# Patient Record
Sex: Male | Born: 1955
Health system: Southern US, Community
[De-identification: ages and names within clinical notes are randomized; demographics above are authoritative.]

## PROBLEM LIST (undated history)

## (undated) DIAGNOSIS — C801 Malignant (primary) neoplasm, unspecified: Secondary | ICD-10-CM

## (undated) DIAGNOSIS — I10 Essential (primary) hypertension: Secondary | ICD-10-CM

## (undated) DIAGNOSIS — E785 Hyperlipidemia, unspecified: Secondary | ICD-10-CM

## (undated) DIAGNOSIS — M1990 Unspecified osteoarthritis, unspecified site: Secondary | ICD-10-CM

## (undated) HISTORY — PX: TONSILLECTOMY: SUR1361

## (undated) HISTORY — DX: Essential (primary) hypertension: I10

## (undated) HISTORY — PX: OTHER SURGICAL HISTORY: SHX169

## (undated) HISTORY — PX: KNEE ARTHROSCOPY W/ ACL RECONSTRUCTION: SHX1858

## (undated) HISTORY — DX: Hyperlipidemia, unspecified: E78.5

---

## 2013-01-18 ENCOUNTER — Encounter: Payer: Self-pay | Admitting: Podiatry

## 2013-01-18 ENCOUNTER — Ambulatory Visit (INDEPENDENT_AMBULATORY_CARE_PROVIDER_SITE_OTHER): Payer: BC Managed Care – PPO | Admitting: Podiatry

## 2013-01-18 ENCOUNTER — Ambulatory Visit (INDEPENDENT_AMBULATORY_CARE_PROVIDER_SITE_OTHER): Payer: BC Managed Care – PPO

## 2013-01-18 VITALS — BP 157/101 | HR 59 | Resp 16 | Ht 72.0 in | Wt 275.0 lb

## 2013-01-18 DIAGNOSIS — R52 Pain, unspecified: Secondary | ICD-10-CM

## 2013-01-18 DIAGNOSIS — M7750 Other enthesopathy of unspecified foot: Secondary | ICD-10-CM

## 2013-01-18 MED ORDER — TRIAMCINOLONE ACETONIDE 10 MG/ML IJ SUSP
10.0000 mg | Freq: Once | INTRAMUSCULAR | Status: AC
  Administered 2013-01-18: 10 mg

## 2013-01-18 NOTE — Progress Notes (Signed)
Subjective:     Patient ID: Gregory Holt, male   DOB: 25-May-1955, 58 y.o.   MRN: [id number]  Foot Pain   patient presents stating I'm having a lot of pain in the outside of my right foot of approximate 6 weeks duration with somewhat chronic fascially pain that's been there for years   Review of Systems  All other systems reviewed and are negative.       Objective:   Physical Exam  Nursing note and vitals reviewed. Constitutional: He is oriented to person, place, and time.  Cardiovascular: Intact distal pulses.   Musculoskeletal: Normal range of motion.  Neurological: He is oriented to person, place, and time.  Skin: Skin is warm.   neurovascular status intact with discomfort on the outside of the right foot with inflammation and fluid buildup noted around the peroneal tendon and just proximal to this area with no muscle strength loss noted. No equinus condition noted with normal range of motion    Assessment:     Tendinitis of the peroneal group right    Plan:     H&P performed and careful injection of the right lateral peroneal tendon near its insertion into the base of fifth metatarsal 3 mg Kenalog 5 of Xylocaine Marcaine mixture and advice him reduced activity and possible new orthotics in future reappoint her recheck

## 2013-01-18 NOTE — Progress Notes (Signed)
   Subjective:    Patient ID: Gregory Holt, male    DOB: February 07, 1955, 58 y.o.   MRN: [id number]  HPI N foot pain        L R lateral plantar midfoot        D 6 weeks        O unknown        C sharp pain        A no known trigger at times and wt-bearing        T ice, massage, and rest    Review of Systems  Constitutional: Negative.   HENT: Negative.   Eyes: Negative.   Respiratory: Negative.   Cardiovascular: Negative.   Gastrointestinal: Negative.   Endocrine: Negative.   Genitourinary: Negative.   Musculoskeletal: Positive for arthralgias.       Left knee  Skin: Negative.   Allergic/Immunologic: Negative.   Neurological: Negative.   Hematological: Negative.   Psychiatric/Behavioral: Negative.        Objective:   Physical Exam        Assessment & Plan:

## 2013-02-15 ENCOUNTER — Ambulatory Visit
Admission: RE | Admit: 2013-02-15 | Discharge: 2013-02-15 | Disposition: A | Discharge status: Home / Self Care | Payer: No Typology Code available for payment source | Source: Ambulatory Visit | Attending: Family Medicine | Admitting: Family Medicine

## 2013-02-15 ENCOUNTER — Other Ambulatory Visit: Payer: Self-pay | Admitting: Family Medicine

## 2013-02-15 DIAGNOSIS — M25562 Pain in left knee: Secondary | ICD-10-CM

## 2015-07-08 IMAGING — CR DG KNEE 1-2V*L*
3 series · 3 of 3 positions shown · non-contrast
Comparison: None.

ADDENDUM:
The original report was by Dr. Nich Aboytes. The following addendum
is by Dr. Jhovanny Bedoya:

The knee demonstrates findings of Kellgren Lawrence grade 4.
CLINICAL DATA: Chronic left knee pain.  Note injury and surgery.
EXAM:
LEFT KNEE - 1-2 VIEW

[view not recorded (1 of 3)]
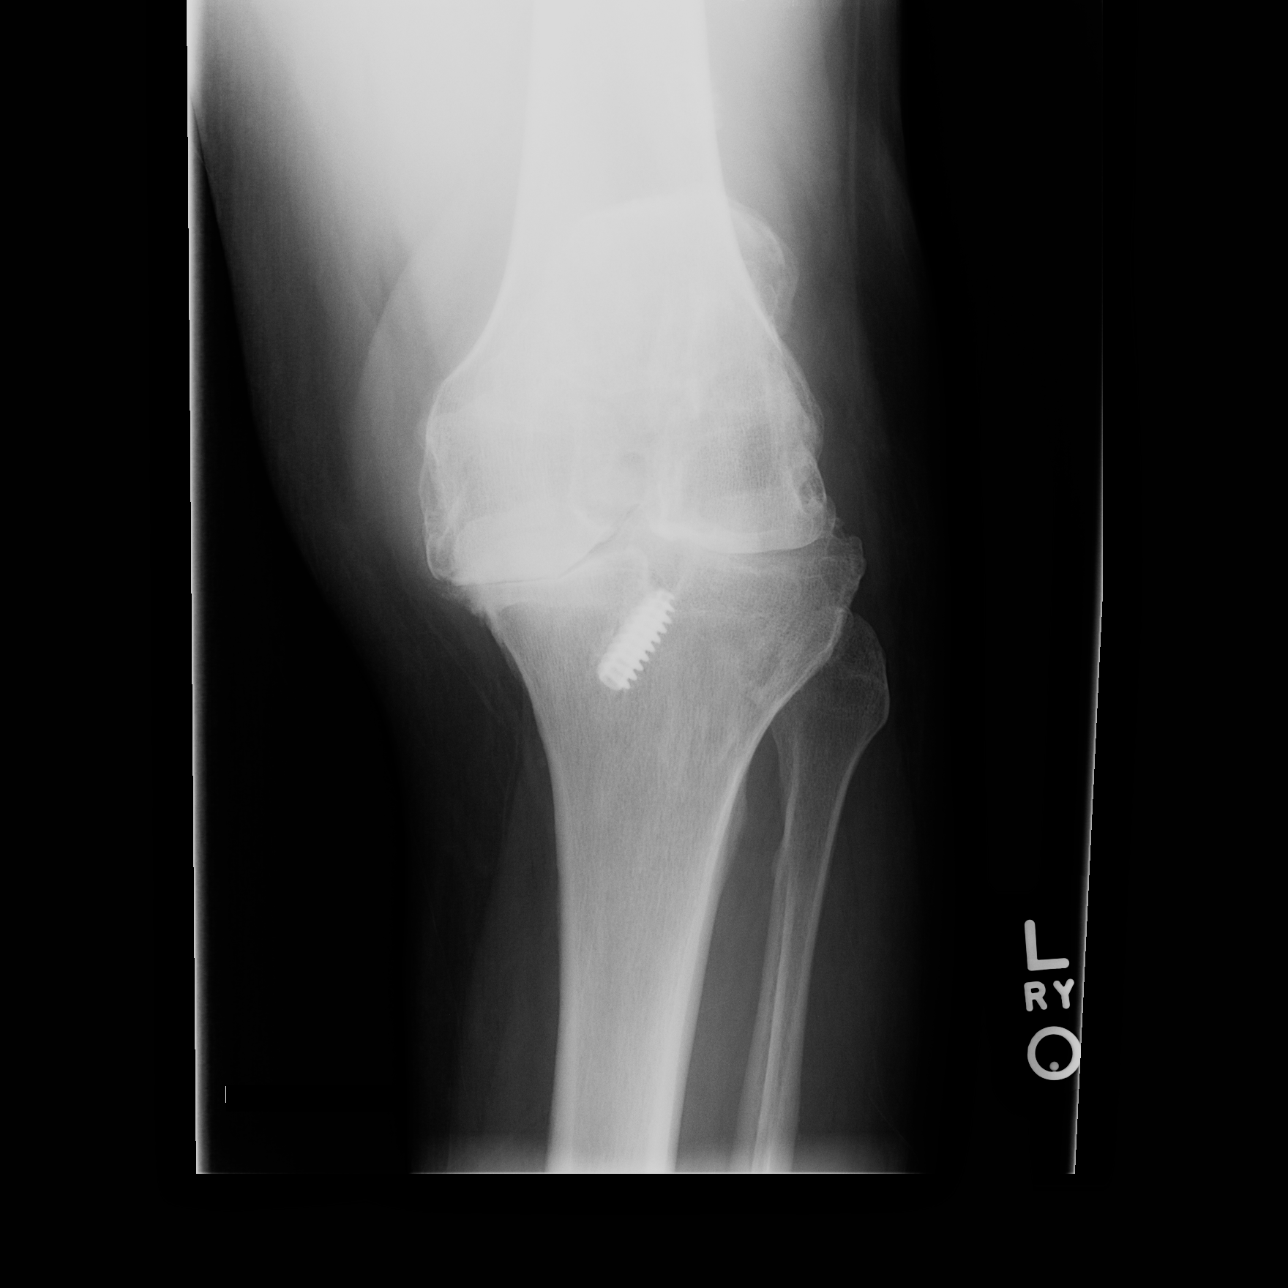

[view not recorded (2 of 3)]
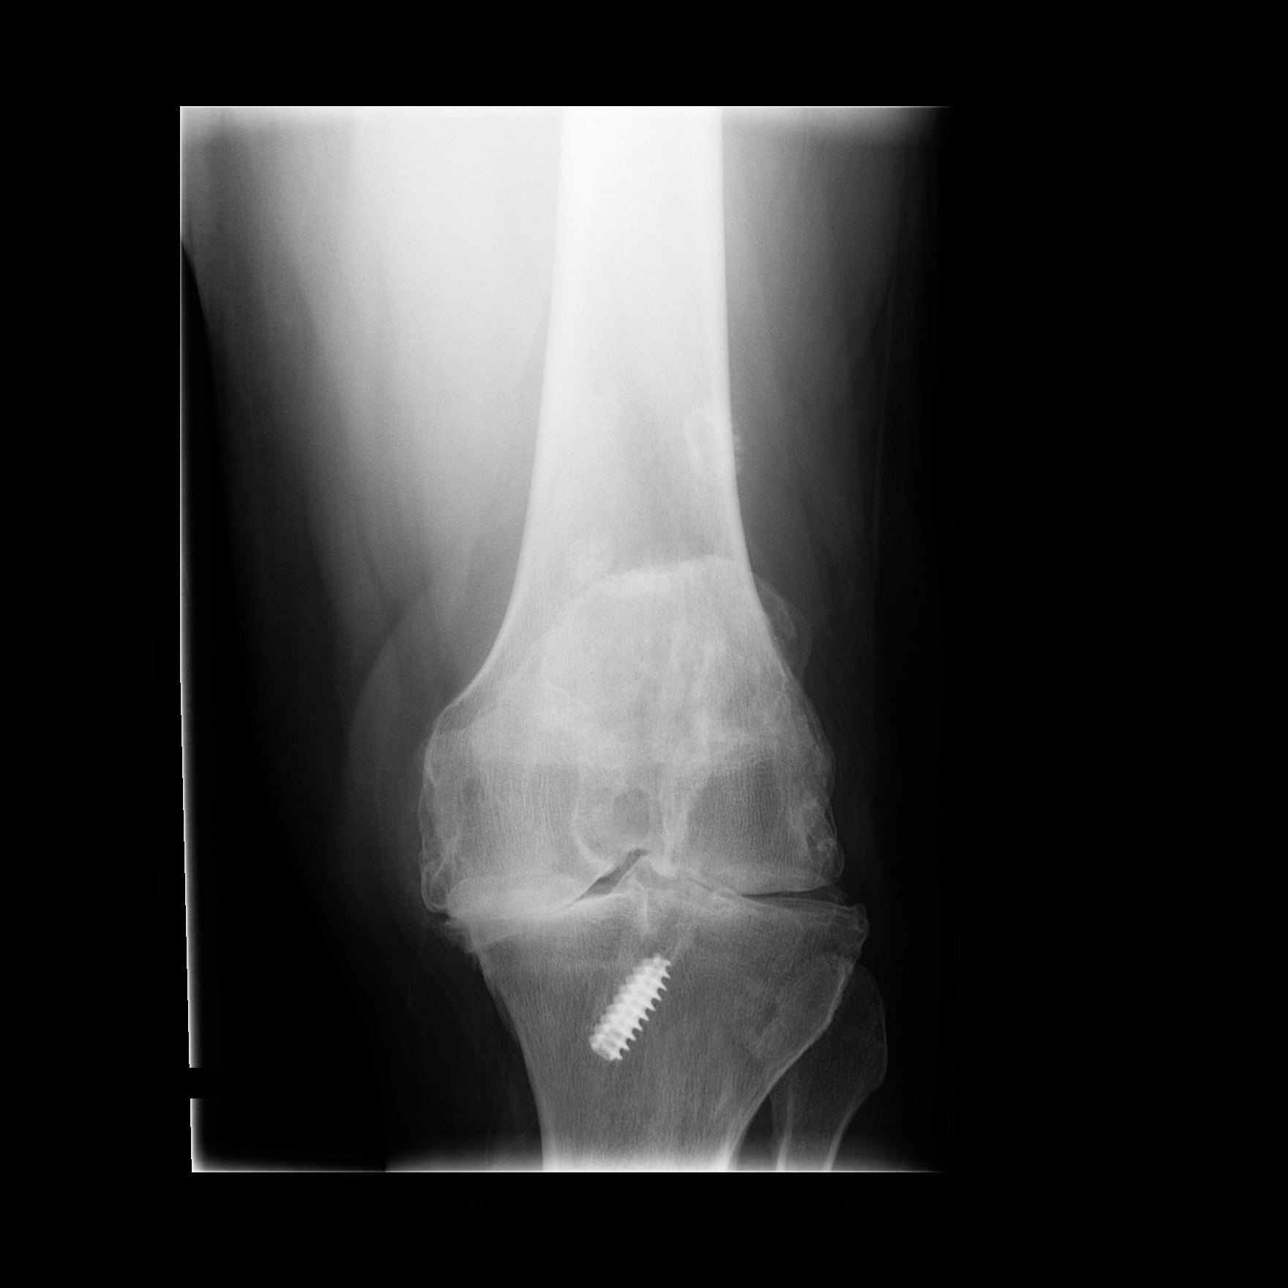

[view not recorded (3 of 3)]
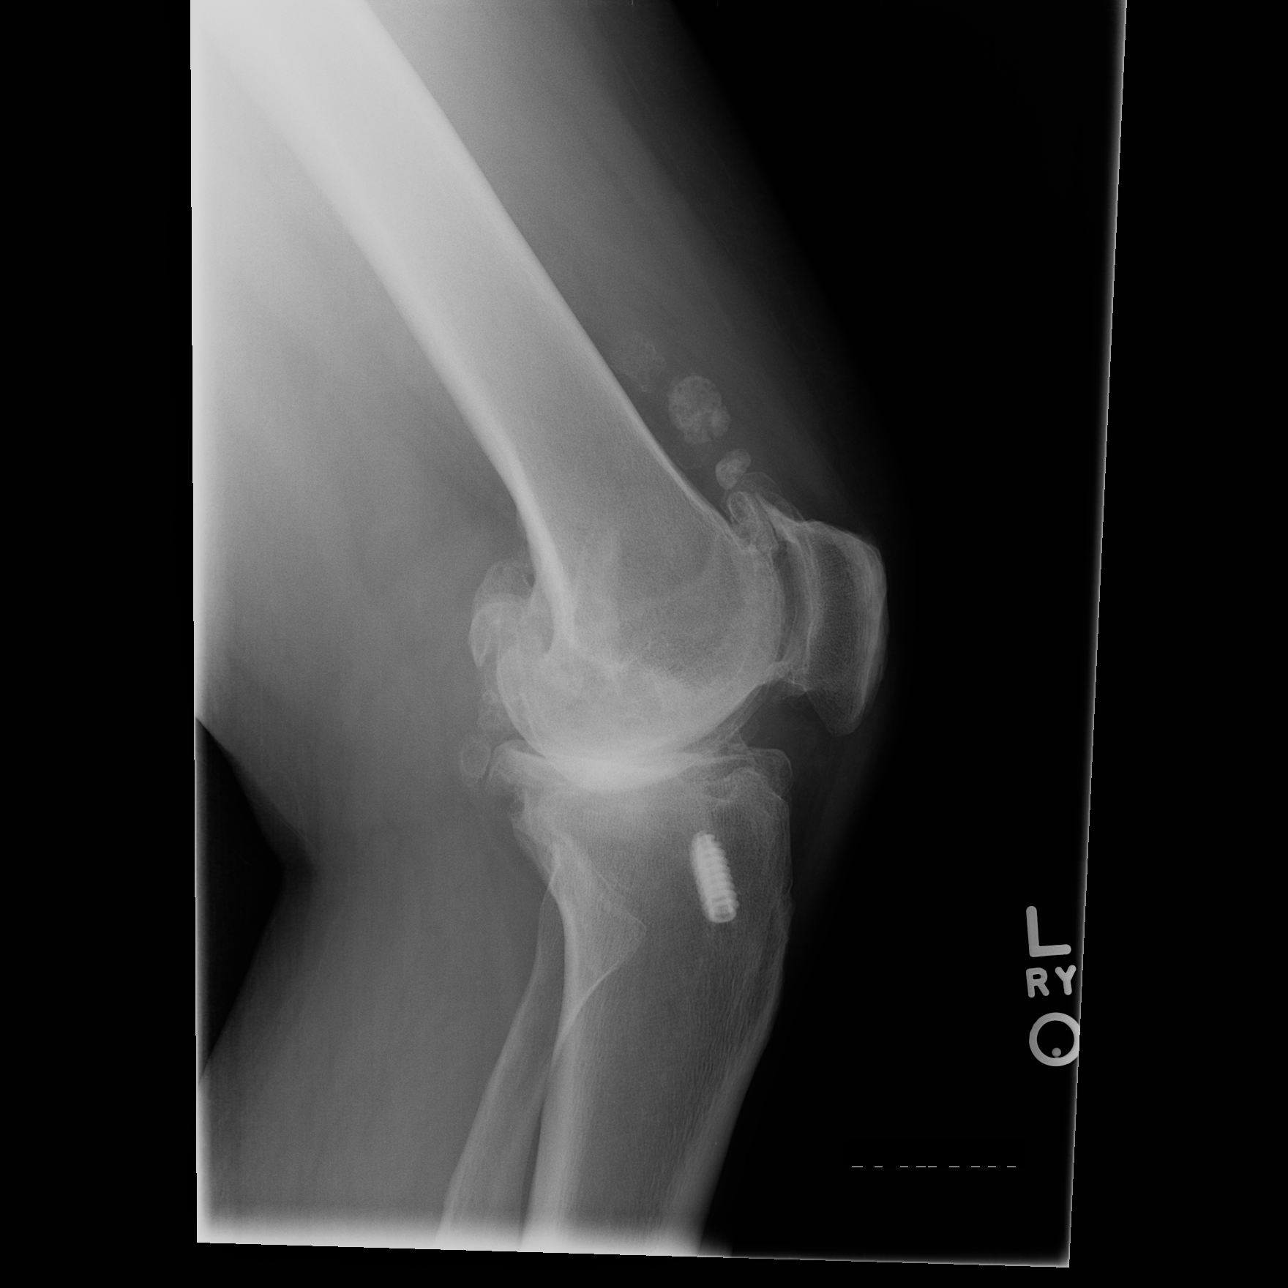

[3 of 3 positions shown; findings below may reference images not displayed]

FINDINGS: There are postsurgical changes consistent with prior ACL
reconstruction. There is an interference screw within the proximal
tibia. There are severe tricompartmental degenerative changes with
advanced joint space loss, osteophytes and multiple intra-articular
loose bodies. There is a small knee joint effusion. No acute
fracture or dislocation is demonstrated.
IMPRESSION: Advanced osteoarthritis with multiple intra-articular loose bodies
status post ACL reconstruction. No acute osseous findings.

## 2015-12-11 ENCOUNTER — Ambulatory Visit: Payer: Self-pay | Admitting: Podiatry

## 2016-01-14 ENCOUNTER — Ambulatory Visit: Payer: Self-pay | Admitting: Podiatry

## 2016-06-03 DIAGNOSIS — H168 Other keratitis: Secondary | ICD-10-CM | POA: Diagnosis not present

## 2016-06-15 DIAGNOSIS — M25562 Pain in left knee: Secondary | ICD-10-CM | POA: Diagnosis not present

## 2016-06-15 DIAGNOSIS — G8929 Other chronic pain: Secondary | ICD-10-CM | POA: Diagnosis not present

## 2016-06-15 DIAGNOSIS — M1712 Unilateral primary osteoarthritis, left knee: Secondary | ICD-10-CM | POA: Diagnosis not present

## 2016-07-09 DIAGNOSIS — I10 Essential (primary) hypertension: Secondary | ICD-10-CM | POA: Diagnosis not present

## 2016-07-09 DIAGNOSIS — E7800 Pure hypercholesterolemia, unspecified: Secondary | ICD-10-CM | POA: Diagnosis not present

## 2016-07-09 DIAGNOSIS — M25562 Pain in left knee: Secondary | ICD-10-CM | POA: Diagnosis not present

## 2016-09-03 DIAGNOSIS — G8929 Other chronic pain: Secondary | ICD-10-CM | POA: Diagnosis not present

## 2016-09-03 DIAGNOSIS — M25562 Pain in left knee: Secondary | ICD-10-CM | POA: Diagnosis not present

## 2016-09-03 DIAGNOSIS — M1712 Unilateral primary osteoarthritis, left knee: Secondary | ICD-10-CM | POA: Diagnosis not present

## 2016-11-24 DIAGNOSIS — I10 Essential (primary) hypertension: Secondary | ICD-10-CM | POA: Diagnosis not present

## 2016-11-24 DIAGNOSIS — E7800 Pure hypercholesterolemia, unspecified: Secondary | ICD-10-CM | POA: Diagnosis not present

## 2016-11-24 DIAGNOSIS — R600 Localized edema: Secondary | ICD-10-CM | POA: Diagnosis not present

## 2017-01-17 DIAGNOSIS — M1710 Unilateral primary osteoarthritis, unspecified knee: Secondary | ICD-10-CM | POA: Insufficient documentation

## 2017-01-17 DIAGNOSIS — M179 Osteoarthritis of knee, unspecified: Secondary | ICD-10-CM | POA: Insufficient documentation

## 2017-01-19 NOTE — H&P (Signed)
TOTAL KNEE ADMISSION H&P  Patient is being admitted for left total knee arthroplasty.  Subjective:  Chief Complaint:   Left knee primary OA / pain  HPI: Gregory Holt, 62 y.o. male, has a history of pain and functional disability in the left knee due to arthritis and has failed non-surgical conservative treatments for greater than 12 weeks to include NSAID's and/or analgesics, corticosteriod injections and activity modification.  Onset of symptoms was gradual, starting 4+ years ago with gradually worsening course since that time. The patient noted prior procedures on the knee to include  ACL reconstruction on the left knee(s).  Patient currently rates pain in the left knee(s) at 10 out of 10 with activity. Patient has worsening of pain with activity and weight bearing, pain that interferes with activities of daily living, pain with passive range of motion, crepitus and joint swelling.  Patient has evidence of periarticular osteophytes and joint space narrowing by imaging studies.  There is no active infection.  Risks, benefits and expectations were discussed with the patient.  Risks including but not limited to the risk of anesthesia, blood clots, nerve damage, blood vessel damage, failure of the prosthesis, infection and up to and including death.  Patient understand the risks, benefits and expectations and wishes to proceed with surgery.   PCP: Maury Dus, MD  D/C Plans:       Home  Post-op Meds:       No Rx given  Tranexamic Acid:      To be given - IV   Decadron:      Is to be given  FYI:     ASA  Norco (ok with Norco, oxy caused him to itch)  DME:   Pt already has equipment   PT:   OPPT Rx given    Past Medical History:  Diagnosis Date  . Hyperlipidemia   . Hypertension     Past Surgical History:  Procedure Laterality Date  . KNEE ARTHROSCOPY W/ ACL RECONSTRUCTION Left   . TONSILLECTOMY      No current facility-administered medications for this encounter.    Current  Outpatient Medications  Medication Sig Dispense Refill Last Dose  . acetaminophen (TYLENOL) 500 MG tablet Take 1,000 mg by mouth every 8 (eight) hours as needed for mild pain.     Marland Kitchen amLODipine (NORVASC) 5 MG tablet Take 5 mg by mouth daily.     Marland Kitchen ibuprofen (ADVIL,MOTRIN) 800 MG tablet Take 800 mg by mouth 2 (two) times daily as needed for mild pain (Alternates between Duexis and Ibuprofen).      . Ibuprofen-Famotidine 800-26.6 MG TABS Take 1 tablet by mouth 2 (two) times daily. Alternates between Duexis and Ibuprofen     . Iodoquinol-HC-Aloe Polysacch (ALCORTIN A EX) Apply 1 application topically daily as needed (rash).     Marland Kitchen losartan-hydrochlorothiazide (HYZAAR) 100-25 MG tablet Take 1 tablet by mouth daily.      Allergies  Allergen Reactions  . Oxycodone Itching    Social History   Tobacco Use  . Smoking status: Never Smoker  Substance Use Topics  . Alcohol use: Not on file    Family History  Problem Relation Age of Onset  . Hypertension Mother   . Heart disease Father      Review of Systems  Constitutional: Negative.   HENT: Negative.   Eyes: Negative.   Respiratory: Negative.   Cardiovascular: Negative.   Gastrointestinal: Negative.   Genitourinary: Negative.   Musculoskeletal: Positive for back pain and joint  pain.  Skin: Negative.   Neurological: Negative.   Endo/Heme/Allergies: Negative.   Psychiatric/Behavioral: Negative.     Objective:  Physical Exam  Constitutional: He is oriented to person, place, and time. He appears well-developed.  HENT:  Head: Normocephalic.  Eyes: Pupils are equal, round, and reactive to light.  Neck: Neck supple. No JVD present. No tracheal deviation present. No thyromegaly present.  Cardiovascular: Normal rate, regular rhythm and intact distal pulses.  No murmur heard. Respiratory: Effort normal and breath sounds normal. No respiratory distress. He has no wheezes.  GI: Soft. There is no tenderness. There is no guarding.   Musculoskeletal:       Left knee: He exhibits decreased range of motion, swelling and bony tenderness. He exhibits no ecchymosis, no deformity, no laceration and no erythema. Tenderness found.  Lymphadenopathy:    He has no cervical adenopathy.  Neurological: He is alert and oriented to person, place, and time.  Skin: Skin is warm and dry.  Psychiatric: He has a normal mood and affect.      Labs:  Estimated body mass index is 37.3 kg/m as calculated from the following:   Height as of 01/18/13: 6' (1.829 m).   Weight as of 01/18/13: 124.7 kg (275 lb).   Imaging Review Plain radiographs demonstrate severe degenerative joint disease of the left knee(s). The overall alignment is varus. The bone quality appears to be good for age and reported activity level.  Assessment/Plan:  End stage arthritis, left knee   The patient history, physical examination, clinical judgment of the provider and imaging studies are consistent with end stage degenerative joint disease of the left knee(s) and total knee arthroplasty is deemed medically necessary. The treatment options including medical management, injection therapy arthroscopy and arthroplasty were discussed at length. The risks and benefits of total knee arthroplasty were presented and reviewed. The risks due to aseptic loosening, infection, stiffness, patella tracking problems, thromboembolic complications and other imponderables were discussed. The patient acknowledged the explanation, agreed to proceed with the plan and consent was signed. Patient is being admitted for inpatient treatment for surgery, pain control, PT, OT, prophylactic antibiotics, VTE prophylaxis, progressive ambulation and ADL's and discharge planning. The patient is planning to be discharged home.      Gregory Pugh Travor Royce   PA-C  01/19/2017, 11:00 AM

## 2017-01-21 ENCOUNTER — Ambulatory Visit (INDEPENDENT_AMBULATORY_CARE_PROVIDER_SITE_OTHER): Payer: 59

## 2017-01-21 ENCOUNTER — Encounter: Payer: Self-pay | Admitting: Podiatry

## 2017-01-21 ENCOUNTER — Ambulatory Visit: Payer: 59 | Admitting: Podiatry

## 2017-01-21 ENCOUNTER — Other Ambulatory Visit: Payer: Self-pay | Admitting: Podiatry

## 2017-01-21 DIAGNOSIS — M79671 Pain in right foot: Secondary | ICD-10-CM

## 2017-01-21 DIAGNOSIS — M779 Enthesopathy, unspecified: Secondary | ICD-10-CM

## 2017-01-21 DIAGNOSIS — M722 Plantar fascial fibromatosis: Secondary | ICD-10-CM

## 2017-01-21 MED ORDER — TRIAMCINOLONE ACETONIDE 10 MG/ML IJ SUSP
10.0000 mg | Freq: Once | INTRAMUSCULAR | Status: AC
  Administered 2017-01-21: 10 mg

## 2017-01-21 NOTE — Progress Notes (Signed)
Subjective:   Patient ID: Gregory Holt, male   DOB: 62 y.o.   MRN: [id number]   HPI Patient presents with a painful arch right and pain in the outside of the right foot and states that he's getting ready for knee replacement and he cannot bear good weight on this foot and it's been getting worse over the last few months. Patient does not smoke and likes to be active   Review of Systems  All other systems reviewed and are negative.       Objective:  Physical Exam  Constitutional: He appears well-developed and well-nourished.  Cardiovascular: Intact distal pulses.  Pulmonary/Chest: Effort normal.  Musculoskeletal: Normal range of motion.  Neurological: He is alert.  Skin: Skin is warm.  Nursing note and vitals reviewed.   Neurovascular status intact muscle strength adequate range of motion within normal limits with patient found to have exquisite discomfort in the mid arch area right with inflammation fluid buildup and discomfort in the peroneal insertion right with pain with palpation. Has good digital perfusion and well oriented 3     Assessment:  Acute plantar fasciitis right with peroneal tendinitis right     Plan:  H&P condition reviewed and I went ahead and injected the sheath right 3 Milligan Kenalog 5 mill grams Xylocaine and I injected the mid arch area right 3 Milligan Kenalog 5 mill grams Xylocaine and applied fascial brace to lift the arch up. Reappoint to recheck if symptoms were to persist after having the knee replacement  X-rays indicate that the patient is a moderate depression of the arch but no indications of advanced arthritis

## 2017-01-25 NOTE — Patient Instructions (Signed)
Dearl Rudden  01/25/2017   Your procedure is scheduled on: 01/31/2017    Report to [facility] Main  Entrance  Follow signs to Short Stay on first floor at 530 AM  Call this number if you have problems the morning of surgery [phone]   Remember: Do not eat food or drink liquids :After Midnight.     Take these medicines the morning of surgery with A SIP OF WATER: Amlodipine ( NOrvasc)                                 You may not have any metal on your body including hair pins and              piercings  Do not wear jewelry,  lotions, powders or perfumes, deodorant           .              Men may shave face and neck.   Do not bring valuables to the hospital. [place].  Contacts, dentures or bridgework may not be worn into surgery.  Leave suitcase in the car. After surgery it may be brought to your room.       Special Instructions:               Please read over the following fact sheets you were given: _____________________________________________________________________             [facility] - Preparing for Surgery Before surgery, you can play an important role.  Because skin is not sterile, your skin needs to be as free of germs as possible.  You can reduce the number of germs on your skin by washing with CHG (chlorahexidine gluconate) soap before surgery.  CHG is an antiseptic cleaner which kills germs and bonds with the skin to continue killing germs even after washing. Please DO NOT use if you have an allergy to CHG or antibacterial soaps.  If your skin becomes reddened/irritated stop using the CHG and inform your nurse when you arrive at Short Stay. Do not shave (including legs and underarms) for at least 48 hours prior to the first CHG shower.  You may shave your face/neck. Please follow these instructions carefully:  1.  Shower with CHG Soap the night before surgery and the  morning of  Surgery.  2.  If you choose to wash your hair, wash your hair first as usual with your  normal  shampoo.  3.  After you shampoo, rinse your hair and body thoroughly to remove the  shampoo.                           4.  Use CHG as you would any other liquid soap.  You can apply chg directly  to the skin and wash                       Gently with a scrungie or clean washcloth.  5.  Apply the CHG Soap to your body ONLY FROM THE NECK DOWN.   Do not use on face/ open  Wound or open sores. Avoid contact with eyes, ears mouth and genitals (private parts).                       Wash face,  Genitals (private parts) with your normal soap.             6.  Wash thoroughly, paying special attention to the area where your surgery  will be performed.  7.  Thoroughly rinse your body with warm water from the neck down.  8.  DO NOT shower/wash with your normal soap after using and rinsing off  the CHG Soap.                9.  Pat yourself dry with a clean towel.            10.  Wear clean pajamas.            11.  Place clean sheets on your bed the night of your first shower and do not  sleep with pets. Day of Surgery : Do not apply any lotions/deodorants the morning of surgery.  Please wear clean clothes to the hospital/surgery center.  FAILURE TO FOLLOW THESE INSTRUCTIONS MAY RESULT IN THE CANCELLATION OF YOUR SURGERY PATIENT SIGNATURE_________________________________  NURSE SIGNATURE__________________________________  ________________________________________________________________________  WHAT IS A BLOOD TRANSFUSION? Blood Transfusion Information  A transfusion is the replacement of blood or some of its parts. Blood is made up of multiple cells which provide different functions.  Red blood cells carry oxygen and are used for blood loss replacement.  White blood cells fight against infection.  Platelets control bleeding.  Plasma helps clot blood.  Other blood products are  available for specialized needs, such as hemophilia or other clotting disorders. BEFORE THE TRANSFUSION  Who gives blood for transfusions?   Healthy volunteers who are fully evaluated to make sure their blood is safe. This is blood bank blood. Transfusion therapy is the safest it has ever been in the practice of medicine. Before blood is taken from a donor, a complete history is taken to make sure that person has no history of diseases nor engages in risky social behavior (examples are intravenous drug use or sexual activity with multiple partners). The donor's travel history is screened to minimize risk of transmitting infections, such as malaria. The donated blood is tested for signs of infectious diseases, such as HIV and hepatitis. The blood is then tested to be sure it is compatible with you in order to minimize the chance of a transfusion reaction. If you or a relative donates blood, this is often done in anticipation of surgery and is not appropriate for emergency situations. It takes many days to process the donated blood. RISKS AND COMPLICATIONS Although transfusion therapy is very safe and saves many lives, the main dangers of transfusion include:   Getting an infectious disease.  Developing a transfusion reaction. This is an allergic reaction to something in the blood you were given. Every precaution is taken to prevent this. The decision to have a blood transfusion has been considered carefully by your caregiver before blood is given. Blood is not given unless the benefits outweigh the risks. AFTER THE TRANSFUSION  Right after receiving a blood transfusion, you will usually feel much better and more energetic. This is especially true if your red blood cells have gotten low (anemic). The transfusion raises the level of the red blood cells which carry oxygen, and this usually causes an energy increase.  The  nurse administering the transfusion will monitor you carefully for  complications. HOME CARE INSTRUCTIONS  No special instructions are needed after a transfusion. You may find your energy is better. Speak with your caregiver about any limitations on activity for underlying diseases you may have. SEEK MEDICAL CARE IF:   Your condition is not improving after your transfusion.  You develop redness or irritation at the intravenous (IV) site. SEEK IMMEDIATE MEDICAL CARE IF:  Any of the following symptoms occur over the next 12 hours:  Shaking chills.  You have a temperature by mouth above 102 F (38.9 C), not controlled by medicine.  Chest, back, or muscle pain.  People around you feel you are not acting correctly or are confused.  Shortness of breath or difficulty breathing.  Dizziness and fainting.  You get a rash or develop hives.  You have a decrease in urine output.  Your urine turns a dark color or changes to pink, red, or brown. Any of the following symptoms occur over the next 10 days:  You have a temperature by mouth above 102 F (38.9 C), not controlled by medicine.  Shortness of breath.  Weakness after normal activity.  The white part of the eye turns yellow (jaundice).  You have a decrease in the amount of urine or are urinating less often.  Your urine turns a dark color or changes to pink, red, or brown. Document Released: 12/26/1999 Document Revised: 03/22/2011 Document Reviewed: 08/14/2007 ExitCare Patient Information 2014 [place].  _______________________________________________________________________  Incentive Spirometer  An incentive spirometer is a tool that can help keep your lungs clear and active. This tool measures how well you are filling your lungs with each breath. Taking long deep breaths may help reverse or decrease the chance of developing breathing (pulmonary) problems (especially infection) following:  A long period of time when you are unable to move or be active. BEFORE THE PROCEDURE   If  the spirometer includes an indicator to show your best effort, your nurse or respiratory therapist will set it to a desired goal.  If possible, sit up straight or lean slightly forward. Try not to slouch.  Hold the incentive spirometer in an upright position. INSTRUCTIONS FOR USE  1. Sit on the edge of your bed if possible, or sit up as far as you can in bed or on a chair. 2. Hold the incentive spirometer in an upright position. 3. Breathe out normally. 4. Place the mouthpiece in your mouth and seal your lips tightly around it. 5. Breathe in slowly and as deeply as possible, raising the piston or the ball toward the top of the column. 6. Hold your breath for 3-5 seconds or for as long as possible. Allow the piston or ball to fall to the bottom of the column. 7. Remove the mouthpiece from your mouth and breathe out normally. 8. Rest for a few seconds and repeat Steps 1 through 7 at least 10 times every 1-2 hours when you are awake. Take your time and take a few normal breaths between deep breaths. 9. The spirometer may include an indicator to show your best effort. Use the indicator as a goal to work toward during each repetition. 10. After each set of 10 deep breaths, practice coughing to be sure your lungs are clear. If you have an incision (the cut made at the time of surgery), support your incision when coughing by placing a pillow or rolled up towels firmly against it. Once you are able to get  out of bed, walk around indoors and cough well. You may stop using the incentive spirometer when instructed by your caregiver.  RISKS AND COMPLICATIONS  Take your time so you do not get dizzy or light-headed.  If you are in pain, you may need to take or ask for pain medication before doing incentive spirometry. It is harder to take a deep breath if you are having pain. AFTER USE  Rest and breathe slowly and easily.  It can be helpful to keep track of a log of your progress. Your caregiver can  provide you with a simple table to help with this. If you are using the spirometer at home, follow these instructions: [place] IF:   You are having difficultly using the spirometer.  You have trouble using the spirometer as often as instructed.  Your pain medication is not giving enough relief while using the spirometer.  You develop fever of 100.5 F (38.1 C) or higher. SEEK IMMEDIATE MEDICAL CARE IF:   You cough up bloody sputum that had not been present before.  You develop fever of 102 F (38.9 C) or greater.  You develop worsening pain at or near the incision site. MAKE SURE YOU:   Understand these instructions.  Will watch your condition.  Will get help right away if you are not doing well or get worse. Document Released: 05/10/2006 Document Revised: 03/22/2011 Document Reviewed: 07/11/2006 [facility] Patient Information 2014 [place], [place].   ________________________________________________________________________

## 2017-01-26 ENCOUNTER — Other Ambulatory Visit: Payer: Self-pay

## 2017-01-26 ENCOUNTER — Encounter (HOSPITAL_COMMUNITY): Payer: Self-pay

## 2017-01-26 ENCOUNTER — Encounter (HOSPITAL_COMMUNITY)
Admission: RE | Admit: 2017-01-26 | Discharge: 2017-01-26 | Disposition: A | Discharge status: Home / Self Care | Payer: 59 | Source: Ambulatory Visit | Attending: Orthopedic Surgery | Admitting: Orthopedic Surgery

## 2017-01-26 DIAGNOSIS — Z01810 Encounter for preprocedural cardiovascular examination: Secondary | ICD-10-CM | POA: Diagnosis present

## 2017-01-26 DIAGNOSIS — R9431 Abnormal electrocardiogram [ECG] [EKG]: Secondary | ICD-10-CM | POA: Diagnosis not present

## 2017-01-26 DIAGNOSIS — M1712 Unilateral primary osteoarthritis, left knee: Secondary | ICD-10-CM | POA: Insufficient documentation

## 2017-01-26 DIAGNOSIS — Z01818 Encounter for other preprocedural examination: Secondary | ICD-10-CM | POA: Diagnosis not present

## 2017-01-26 HISTORY — DX: Unspecified osteoarthritis, unspecified site: M19.90

## 2017-01-26 HISTORY — DX: Malignant (primary) neoplasm, unspecified: C80.1

## 2017-01-26 LAB — ABO/RH: ABO/RH(D): A POS

## 2017-01-26 LAB — CBC
HCT: 44.5 % (ref 39.0–52.0)
Hemoglobin: 15.2 g/dL (ref 13.0–17.0)
MCH: 31.4 pg (ref 26.0–34.0)
MCHC: 34.2 g/dL (ref 30.0–36.0)
MCV: 91.9 fL (ref 78.0–100.0)
Platelets: 242 10*3/uL (ref 150–400)
RBC: 4.84 MIL/uL (ref 4.22–5.81)
RDW: 12.5 % (ref 11.5–15.5)
WBC: 6.2 10*3/uL (ref 4.0–10.5)

## 2017-01-26 LAB — BASIC METABOLIC PANEL
Anion gap: 8 (ref 5–15)
BUN: 34 mg/dL — AB (ref 6–20)
CALCIUM: 9.3 mg/dL (ref 8.9–10.3)
CO2: 26 mmol/L (ref 22–32)
CREATININE: 0.8 mg/dL (ref 0.61–1.24)
Chloride: 104 mmol/L (ref 101–111)
GFR calc Af Amer: >60 mL/min (ref >60)
GFR calc non Af Amer: >60 mL/min (ref >60)
GLUCOSE: 134 mg/dL — AB (ref 65–99)
Potassium: 3.5 mmol/L (ref 3.5–5.1)
Sodium: 138 mmol/L (ref 135–145)

## 2017-01-26 LAB — SURGICAL PCR SCREEN
MRSA, PCR: NEGATIVE
Staphylococcus aureus: NEGATIVE

## 2017-01-26 NOTE — Progress Notes (Signed)
BMP done 01/26/17 routed via epic to dr Alvan Dame.

## 2017-01-26 NOTE — Progress Notes (Signed)
Final EKG done 01/26/17-epic.   Clearance- Dr Alyson Ingles- 11/27/16-chart.

## 2017-01-30 MED ORDER — DEXTROSE 5 % IV SOLN
3.0000 g | INTRAVENOUS | Status: AC
  Administered 2017-01-31: 3 g via INTRAVENOUS
  Filled 2017-01-30: qty 3

## 2017-01-30 MED ORDER — TRANEXAMIC ACID 1000 MG/10ML IV SOLN
1000.0000 mg | INTRAVENOUS | Status: AC
  Administered 2017-01-31: 1000 mg via INTRAVENOUS
  Filled 2017-01-30: qty 1100

## 2017-01-30 NOTE — Anesthesia Preprocedure Evaluation (Addendum)
Anesthesia Evaluation  Patient identified by MRN, date of birth, ID band Patient awake    Reviewed: Allergy & Precautions, NPO status , Patient's Chart, lab work & pertinent test results  Airway Mallampati: II  TM Distance: >3 FB Neck ROM: Full    Dental no notable dental hx.    Pulmonary neg pulmonary ROS,    Pulmonary exam normal breath sounds clear to auscultation       Cardiovascular hypertension, Pt. on medications negative cardio ROS Normal cardiovascular exam Rhythm:Regular Rate:Normal     Neuro/Psych negative neurological ROS  negative psych ROS   GI/Hepatic negative GI ROS, Neg liver ROS,   Endo/Other  negative endocrine ROSMorbid obesity  Renal/GU negative Renal ROS  negative genitourinary   Musculoskeletal negative musculoskeletal ROS (+) Arthritis , Osteoarthritis,    Abdominal   Peds negative pediatric ROS (+)  Hematology negative hematology ROS (+)   Anesthesia Other Findings   Reproductive/Obstetrics negative OB ROS                            Anesthesia Physical Anesthesia Plan  ASA: III  Anesthesia Plan: Spinal   Post-op Pain Management:  Regional for Post-op pain   Induction:   PONV Risk Score and Plan: 1 and Treatment may vary due to age or medical condition and Propofol infusion  Airway Management Planned: Nasal Cannula, Natural Airway and Mask  Additional Equipment:   Intra-op Plan:   Post-operative Plan:   Informed Consent:   Plan Discussed with: CRNA and Anesthesiologist  Anesthesia Plan Comments: (  )        Anesthesia Quick Evaluation

## 2017-01-31 ENCOUNTER — Ambulatory Visit (HOSPITAL_COMMUNITY): Payer: 59 | Admitting: Anesthesiology

## 2017-01-31 ENCOUNTER — Other Ambulatory Visit: Payer: Self-pay

## 2017-01-31 ENCOUNTER — Observation Stay (HOSPITAL_COMMUNITY)
Admission: RE | Admit: 2017-01-31 | Discharge: 2017-02-01 | Disposition: A | Discharge status: Home / Self Care | Payer: 59 | Source: Ambulatory Visit | Attending: Orthopedic Surgery | Admitting: Orthopedic Surgery

## 2017-01-31 ENCOUNTER — Encounter (HOSPITAL_COMMUNITY)
Admission: RE | Disposition: A | Discharge status: Home / Self Care | Payer: Self-pay | Source: Ambulatory Visit | Attending: Orthopedic Surgery

## 2017-01-31 ENCOUNTER — Encounter (HOSPITAL_COMMUNITY): Payer: Self-pay | Admitting: Emergency Medicine

## 2017-01-31 DIAGNOSIS — Z96659 Presence of unspecified artificial knee joint: Secondary | ICD-10-CM

## 2017-01-31 DIAGNOSIS — G8929 Other chronic pain: Secondary | ICD-10-CM | POA: Insufficient documentation

## 2017-01-31 DIAGNOSIS — M1712 Unilateral primary osteoarthritis, left knee: Secondary | ICD-10-CM | POA: Diagnosis not present

## 2017-01-31 DIAGNOSIS — I10 Essential (primary) hypertension: Secondary | ICD-10-CM | POA: Diagnosis not present

## 2017-01-31 DIAGNOSIS — Z85828 Personal history of other malignant neoplasm of skin: Secondary | ICD-10-CM | POA: Insufficient documentation

## 2017-01-31 DIAGNOSIS — Z791 Long term (current) use of non-steroidal anti-inflammatories (NSAID): Secondary | ICD-10-CM | POA: Insufficient documentation

## 2017-01-31 DIAGNOSIS — M25462 Effusion, left knee: Secondary | ICD-10-CM | POA: Diagnosis not present

## 2017-01-31 DIAGNOSIS — Z6834 Body mass index (BMI) 34.0-34.9, adult: Secondary | ICD-10-CM | POA: Diagnosis not present

## 2017-01-31 DIAGNOSIS — M25562 Pain in left knee: Secondary | ICD-10-CM

## 2017-01-31 DIAGNOSIS — T84498A Other mechanical complication of other internal orthopedic devices, implants and grafts, initial encounter: Secondary | ICD-10-CM | POA: Diagnosis not present

## 2017-01-31 DIAGNOSIS — M25762 Osteophyte, left knee: Secondary | ICD-10-CM | POA: Insufficient documentation

## 2017-01-31 DIAGNOSIS — M659 Synovitis and tenosynovitis, unspecified: Secondary | ICD-10-CM | POA: Insufficient documentation

## 2017-01-31 DIAGNOSIS — E785 Hyperlipidemia, unspecified: Secondary | ICD-10-CM | POA: Diagnosis not present

## 2017-01-31 DIAGNOSIS — Z96652 Presence of left artificial knee joint: Secondary | ICD-10-CM

## 2017-01-31 DIAGNOSIS — G8918 Other acute postprocedural pain: Secondary | ICD-10-CM | POA: Diagnosis not present

## 2017-01-31 DIAGNOSIS — Z79899 Other long term (current) drug therapy: Secondary | ICD-10-CM | POA: Insufficient documentation

## 2017-01-31 HISTORY — PX: TOTAL KNEE ARTHROPLASTY: SHX125

## 2017-01-31 LAB — TYPE AND SCREEN
ABO/RH(D): A POS
ANTIBODY SCREEN: NEGATIVE

## 2017-01-31 SURGERY — ARTHROPLASTY, KNEE, TOTAL
Anesthesia: Spinal | Site: Knee | Laterality: Left

## 2017-01-31 MED ORDER — FENTANYL CITRATE (PF) 100 MCG/2ML IJ SOLN
INTRAMUSCULAR | Status: DC | PRN
  Administered 2017-01-31: 100 ug via INTRAVENOUS

## 2017-01-31 MED ORDER — CIPROFLOXACIN HCL 0.3 % OP SOLN: 1.0000 [drp] | Freq: Three times a day (TID) | OPHTHALMIC | Status: DC

## 2017-01-31 MED ORDER — 0.9 % SODIUM CHLORIDE (POUR BTL) OPTIME
TOPICAL | Status: DC | PRN
  Administered 2017-01-31: 1000 mL

## 2017-01-31 MED ORDER — KETOROLAC TROMETHAMINE 30 MG/ML IJ SOLN: 30.0000 mg | Freq: Once | INTRAMUSCULAR | Status: DC | PRN

## 2017-01-31 MED ORDER — ONDANSETRON HCL 4 MG/2ML IJ SOLN: 4.0000 mg | Freq: Once | INTRAMUSCULAR | Status: DC | PRN

## 2017-01-31 MED ORDER — HYDROCODONE-ACETAMINOPHEN 7.5-325 MG PO TABS
2.0000 | ORAL_TABLET | ORAL | Status: DC | PRN
  Administered 2017-01-31 – 2017-02-01 (×5): 2 via ORAL
  Filled 2017-01-31 (×5): qty 2

## 2017-01-31 MED ORDER — POLYETHYLENE GLYCOL 3350 17 G PO PACK
17.0000 g | PACK | Freq: Two times a day (BID) | ORAL | Status: DC
  Administered 2017-02-01: 08:00:00 17 g via ORAL
  Filled 2017-01-31 (×2): qty 1

## 2017-01-31 MED ORDER — ACETAMINOPHEN 160 MG/5ML PO SOLN: 325.0000 mg | ORAL | Status: DC | PRN

## 2017-01-31 MED ORDER — FERROUS SULFATE 325 (65 FE) MG PO TABS: 325.0000 mg | ORAL_TABLET | Freq: Three times a day (TID) | ORAL | 3 refills | Status: DC

## 2017-01-31 MED ORDER — LIDOCAINE 2% (20 MG/ML) 5 ML SYRINGE
INTRAMUSCULAR | Status: DC | PRN
  Administered 2017-01-31: 40 mg via INTRAVENOUS

## 2017-01-31 MED ORDER — BUPIVACAINE IN DEXTROSE 0.75-8.25 % IT SOLN
INTRATHECAL | Status: DC | PRN
  Administered 2017-01-31: 2 mL via INTRATHECAL

## 2017-01-31 MED ORDER — METHOCARBAMOL 500 MG PO TABS
500.0000 mg | ORAL_TABLET | Freq: Four times a day (QID) | ORAL | Status: DC | PRN
  Administered 2017-01-31 – 2017-02-01 (×2): 500 mg via ORAL
  Filled 2017-01-31 (×2): qty 1

## 2017-01-31 MED ORDER — MEPERIDINE HCL 50 MG/ML IJ SOLN: 6.2500 mg | INTRAMUSCULAR | Status: DC | PRN

## 2017-01-31 MED ORDER — ACETAMINOPHEN 325 MG PO TABS: 325.0000 mg | ORAL_TABLET | ORAL | Status: DC | PRN

## 2017-01-31 MED ORDER — DEXAMETHASONE SODIUM PHOSPHATE 10 MG/ML IJ SOLN
10.0000 mg | Freq: Once | INTRAMUSCULAR | Status: AC
  Administered 2017-01-31: 10 mg via INTRAVENOUS

## 2017-01-31 MED ORDER — PROPOFOL 10 MG/ML IV BOLUS
INTRAVENOUS | Status: DC | PRN
  Administered 2017-01-31: 20 mg via INTRAVENOUS

## 2017-01-31 MED ORDER — ONDANSETRON HCL 4 MG/2ML IJ SOLN: 4.0000 mg | Freq: Four times a day (QID) | INTRAMUSCULAR | Status: DC | PRN

## 2017-01-31 MED ORDER — ASPIRIN 81 MG PO CHEW: 81.0000 mg | CHEWABLE_TABLET | Freq: Two times a day (BID) | ORAL | 0 refills | Status: AC

## 2017-01-31 MED ORDER — KETOROLAC TROMETHAMINE 30 MG/ML IJ SOLN
INTRAMUSCULAR | Status: AC
  Filled 2017-01-31: qty 1

## 2017-01-31 MED ORDER — HYDROCHLOROTHIAZIDE 25 MG PO TABS
25.0000 mg | ORAL_TABLET | Freq: Every day | ORAL | Status: DC
  Administered 2017-02-01: 12:00:00 25 mg via ORAL
  Filled 2017-01-31: qty 1

## 2017-01-31 MED ORDER — MIDAZOLAM HCL 5 MG/5ML IJ SOLN
INTRAMUSCULAR | Status: DC | PRN
  Administered 2017-01-31: 2 mg via INTRAVENOUS

## 2017-01-31 MED ORDER — PROPOFOL 500 MG/50ML IV EMUL
INTRAVENOUS | Status: DC | PRN
  Administered 2017-01-31: 75 ug/kg/min via INTRAVENOUS

## 2017-01-31 MED ORDER — HYDROCODONE-ACETAMINOPHEN 7.5-325 MG PO TABS: 1.0000 | ORAL_TABLET | ORAL | 0 refills | Status: DC | PRN

## 2017-01-31 MED ORDER — HYDROMORPHONE HCL 1 MG/ML IJ SOLN: 0.5000 mg | INTRAMUSCULAR | Status: DC | PRN

## 2017-01-31 MED ORDER — BUPIVACAINE HCL (PF) 0.25 % IJ SOLN
INTRAMUSCULAR | Status: AC
  Filled 2017-01-31: qty 30

## 2017-01-31 MED ORDER — SODIUM CHLORIDE 0.9 % IJ SOLN
INTRAMUSCULAR | Status: DC | PRN
  Administered 2017-01-31: 30 mL

## 2017-01-31 MED ORDER — CEFAZOLIN SODIUM-DEXTROSE 2-4 GM/100ML-% IV SOLN
2.0000 g | Freq: Four times a day (QID) | INTRAVENOUS | Status: AC
  Administered 2017-01-31 (×2): 2 g via INTRAVENOUS
  Filled 2017-01-31 (×2): qty 100

## 2017-01-31 MED ORDER — OXYCODONE HCL 5 MG/5ML PO SOLN
5.0000 mg | Freq: Once | ORAL | Status: DC | PRN
  Filled 2017-01-31: qty 5

## 2017-01-31 MED ORDER — ACETAMINOPHEN 325 MG PO TABS: 650.0000 mg | ORAL_TABLET | ORAL | Status: DC | PRN

## 2017-01-31 MED ORDER — FERROUS SULFATE 325 (65 FE) MG PO TABS
325.0000 mg | ORAL_TABLET | Freq: Three times a day (TID) | ORAL | Status: DC
  Administered 2017-01-31 – 2017-02-01 (×3): 325 mg via ORAL
  Filled 2017-01-31 (×3): qty 1

## 2017-01-31 MED ORDER — ONDANSETRON HCL 4 MG/2ML IJ SOLN
INTRAMUSCULAR | Status: AC
  Filled 2017-01-31: qty 2

## 2017-01-31 MED ORDER — METHOCARBAMOL 1000 MG/10ML IJ SOLN
500.0000 mg | Freq: Four times a day (QID) | INTRAVENOUS | Status: DC | PRN
  Administered 2017-01-31: 500 mg via INTRAVENOUS
  Filled 2017-01-31: qty 550

## 2017-01-31 MED ORDER — KETOROLAC TROMETHAMINE 0.5 % OP SOLN
1.0000 [drp] | Freq: Three times a day (TID) | OPHTHALMIC | Status: DC | PRN
  Filled 2017-01-31: qty 5

## 2017-01-31 MED ORDER — PROPOFOL 10 MG/ML IV BOLUS
INTRAVENOUS | Status: AC
  Filled 2017-01-31: qty 20

## 2017-01-31 MED ORDER — ACETAMINOPHEN 650 MG RE SUPP: 650.0000 mg | RECTAL | Status: DC | PRN

## 2017-01-31 MED ORDER — CELECOXIB 200 MG PO CAPS
200.0000 mg | ORAL_CAPSULE | Freq: Two times a day (BID) | ORAL | Status: DC
  Administered 2017-01-31 – 2017-02-01 (×2): 200 mg via ORAL
  Filled 2017-01-31 (×2): qty 1

## 2017-01-31 MED ORDER — FENTANYL CITRATE (PF) 100 MCG/2ML IJ SOLN: 25.0000 ug | INTRAMUSCULAR | Status: DC | PRN

## 2017-01-31 MED ORDER — SODIUM CHLORIDE 0.9 % IR SOLN
Status: DC | PRN
  Administered 2017-01-31: 1000 mL

## 2017-01-31 MED ORDER — DIPHENHYDRAMINE HCL 12.5 MG/5ML PO ELIX: 12.5000 mg | ORAL_SOLUTION | ORAL | Status: DC | PRN

## 2017-01-31 MED ORDER — METHOCARBAMOL 500 MG PO TABS: 500.0000 mg | ORAL_TABLET | Freq: Four times a day (QID) | ORAL | 0 refills | Status: AC | PRN

## 2017-01-31 MED ORDER — DOCUSATE SODIUM 100 MG PO CAPS
100.0000 mg | ORAL_CAPSULE | Freq: Two times a day (BID) | ORAL | Status: DC
  Administered 2017-01-31 – 2017-02-01 (×2): 100 mg via ORAL
  Filled 2017-01-31 (×2): qty 1

## 2017-01-31 MED ORDER — OXYCODONE HCL 5 MG PO TABS: 5.0000 mg | ORAL_TABLET | Freq: Once | ORAL | Status: DC | PRN

## 2017-01-31 MED ORDER — LOSARTAN POTASSIUM 50 MG PO TABS
100.0000 mg | ORAL_TABLET | Freq: Every day | ORAL | Status: DC
  Administered 2017-02-01: 12:00:00 100 mg via ORAL
  Filled 2017-01-31: qty 2

## 2017-01-31 MED ORDER — LIDOCAINE 2% (20 MG/ML) 5 ML SYRINGE
INTRAMUSCULAR | Status: AC
  Filled 2017-01-31: qty 5

## 2017-01-31 MED ORDER — KETOROLAC TROMETHAMINE 30 MG/ML IJ SOLN
INTRAMUSCULAR | Status: DC | PRN
  Administered 2017-01-31: 30 mg via INTRAMUSCULAR

## 2017-01-31 MED ORDER — SODIUM CHLORIDE 0.9 % IV SOLN
INTRAVENOUS | Status: DC
  Administered 2017-01-31: 12:00:00 via INTRAVENOUS

## 2017-01-31 MED ORDER — MIDAZOLAM HCL 2 MG/2ML IJ SOLN
INTRAMUSCULAR | Status: AC
  Filled 2017-01-31: qty 2

## 2017-01-31 MED ORDER — POLYETHYLENE GLYCOL 3350 17 G PO PACK: 17.0000 g | PACK | Freq: Two times a day (BID) | ORAL | 0 refills | Status: DC

## 2017-01-31 MED ORDER — ONDANSETRON HCL 4 MG PO TABS: 4.0000 mg | ORAL_TABLET | Freq: Four times a day (QID) | ORAL | Status: DC | PRN

## 2017-01-31 MED ORDER — AMLODIPINE BESYLATE 5 MG PO TABS
5.0000 mg | ORAL_TABLET | Freq: Every day | ORAL | Status: DC
  Administered 2017-02-01: 12:00:00 5 mg via ORAL
  Filled 2017-01-31: qty 1

## 2017-01-31 MED ORDER — DOCUSATE SODIUM 100 MG PO CAPS: 100.0000 mg | ORAL_CAPSULE | Freq: Two times a day (BID) | ORAL | 0 refills | Status: DC

## 2017-01-31 MED ORDER — DEXAMETHASONE SODIUM PHOSPHATE 10 MG/ML IJ SOLN
INTRAMUSCULAR | Status: AC
  Filled 2017-01-31: qty 1

## 2017-01-31 MED ORDER — SODIUM CHLORIDE 0.9 % IJ SOLN
INTRAMUSCULAR | Status: AC
  Filled 2017-01-31: qty 50

## 2017-01-31 MED ORDER — PHENOL 1.4 % MT LIQD: 1.0000 | OROMUCOSAL | Status: DC | PRN

## 2017-01-31 MED ORDER — METOCLOPRAMIDE HCL 5 MG PO TABS: 5.0000 mg | ORAL_TABLET | Freq: Three times a day (TID) | ORAL | Status: DC | PRN

## 2017-01-31 MED ORDER — ALUM & MAG HYDROXIDE-SIMETH 200-200-20 MG/5ML PO SUSP: 15.0000 mL | ORAL | Status: DC | PRN

## 2017-01-31 MED ORDER — TRANEXAMIC ACID 1000 MG/10ML IV SOLN
1000.0000 mg | Freq: Once | INTRAVENOUS | Status: AC
  Administered 2017-01-31: 12:00:00 1000 mg via INTRAVENOUS
  Filled 2017-01-31: qty 1100

## 2017-01-31 MED ORDER — LOSARTAN POTASSIUM-HCTZ 100-25 MG PO TABS: 1.0000 | ORAL_TABLET | Freq: Every day | ORAL | Status: DC

## 2017-01-31 MED ORDER — MENTHOL 3 MG MT LOZG: 1.0000 | LOZENGE | OROMUCOSAL | Status: DC | PRN

## 2017-01-31 MED ORDER — ASPIRIN 81 MG PO CHEW
81.0000 mg | CHEWABLE_TABLET | Freq: Two times a day (BID) | ORAL | Status: DC
  Administered 2017-01-31 – 2017-02-01 (×2): 81 mg via ORAL
  Filled 2017-01-31 (×2): qty 1

## 2017-01-31 MED ORDER — KETOROLAC TROMETHAMINE 0.5 % OP SOLN
1.0000 [drp] | Freq: Three times a day (TID) | OPHTHALMIC | Status: AC | PRN
  Administered 2017-01-31: 19:00:00 1 [drp] via OPHTHALMIC

## 2017-01-31 MED ORDER — HYDROCODONE-ACETAMINOPHEN 7.5-325 MG PO TABS
1.0000 | ORAL_TABLET | ORAL | Status: DC | PRN
  Administered 2017-01-31 (×2): 1 via ORAL
  Filled 2017-01-31 (×2): qty 1

## 2017-01-31 MED ORDER — DEXAMETHASONE SODIUM PHOSPHATE 10 MG/ML IJ SOLN
10.0000 mg | Freq: Once | INTRAMUSCULAR | Status: AC
  Administered 2017-02-01: 10 mg via INTRAVENOUS
  Filled 2017-01-31: qty 1

## 2017-01-31 MED ORDER — CIPROFLOXACIN HCL 0.3 % OP SOLN
1.0000 [drp] | Freq: Three times a day (TID) | OPHTHALMIC | Status: AC
  Administered 2017-01-31 – 2017-02-01 (×2): 1 [drp] via OPHTHALMIC
  Filled 2017-01-31: qty 2.5

## 2017-01-31 MED ORDER — BUPIVACAINE HCL (PF) 0.25 % IJ SOLN
INTRAMUSCULAR | Status: DC | PRN
  Administered 2017-01-31: 30 mL

## 2017-01-31 MED ORDER — LACTATED RINGERS IV SOLN
INTRAVENOUS | Status: DC | PRN
  Administered 2017-01-31 (×2): via INTRAVENOUS

## 2017-01-31 MED ORDER — BSS IO SOLN
15.0000 mL | Freq: Once | INTRAOCULAR | Status: DC
  Filled 2017-01-31: qty 15

## 2017-01-31 MED ORDER — CHLORHEXIDINE GLUCONATE 4 % EX LIQD: 60.0000 mL | Freq: Once | CUTANEOUS | Status: DC

## 2017-01-31 MED ORDER — METOCLOPRAMIDE HCL 5 MG/ML IJ SOLN: 5.0000 mg | Freq: Three times a day (TID) | INTRAMUSCULAR | Status: DC | PRN

## 2017-01-31 MED ORDER — BISACODYL 10 MG RE SUPP: 10.0000 mg | Freq: Every day | RECTAL | Status: DC | PRN

## 2017-01-31 MED ORDER — ONDANSETRON HCL 4 MG/2ML IJ SOLN
INTRAMUSCULAR | Status: DC | PRN
  Administered 2017-01-31: 4 mg via INTRAVENOUS

## 2017-01-31 MED ORDER — MEPERIDINE HCL 25 MG/ML IJ SOLN: 6.2500 mg | INTRAMUSCULAR | Status: DC | PRN

## 2017-01-31 MED ORDER — BUPIVACAINE HCL (PF) 0.75 % IJ SOLN
INTRAMUSCULAR | Status: DC | PRN
  Administered 2017-01-31: 25 mL

## 2017-01-31 MED ORDER — FENTANYL CITRATE (PF) 100 MCG/2ML IJ SOLN
INTRAMUSCULAR | Status: AC
  Filled 2017-01-31: qty 2

## 2017-01-31 MED ORDER — OXYCODONE HCL 5 MG/5ML PO SOLN: 5.0000 mg | Freq: Once | ORAL | Status: DC | PRN

## 2017-01-31 MED ORDER — PROPOFOL 10 MG/ML IV BOLUS
INTRAVENOUS | Status: AC
Start: 2017-01-31 — End: 2017-01-31
  Filled 2017-01-31: qty 60

## 2017-01-31 MED ORDER — MAGNESIUM CITRATE PO SOLN: 1.0000 | Freq: Once | ORAL | Status: DC | PRN

## 2017-01-31 SURGICAL SUPPLY — 53 items
ADH SKN CLS APL DERMABOND .7 (GAUZE/BANDAGES/DRESSINGS) ×1
BAG DECANTER FOR FLEXI CONT (MISCELLANEOUS) IMPLANT
BAG SPEC THK2 15X12 ZIP CLS (MISCELLANEOUS) ×1
BAG ZIPLOCK 12X15 (MISCELLANEOUS) ×2 IMPLANT
BANDAGE ACE 6X5 VEL STRL LF (GAUZE/BANDAGES/DRESSINGS) ×3 IMPLANT
BLADE SAW SGTL 11.0X1.19X90.0M (BLADE) IMPLANT
BLADE SAW SGTL 13.0X1.19X90.0M (BLADE) ×3 IMPLANT
BOWL SMART MIX CTS (DISPOSABLE) ×3 IMPLANT
CAPT KNEE TOTAL 3 ATTUNE ×2 IMPLANT
CEMENT HV SMART SET (Cement) ×4 IMPLANT
CONT SPEC 4OZ CLIKSEAL STRL BL (MISCELLANEOUS) ×2 IMPLANT
COVER SURGICAL LIGHT HANDLE (MISCELLANEOUS) ×3 IMPLANT
CUFF TOURN SGL QUICK 34 (TOURNIQUET CUFF) ×3
CUFF TRNQT CYL 34X4X40X1 (TOURNIQUET CUFF) ×1 IMPLANT
DECANTER SPIKE VIAL GLASS SM (MISCELLANEOUS) ×3 IMPLANT
DERMABOND ADVANCED (GAUZE/BANDAGES/DRESSINGS) ×2
DERMABOND ADVANCED .7 DNX12 (GAUZE/BANDAGES/DRESSINGS) ×1 IMPLANT
DRAPE U-SHAPE 47X51 STRL (DRAPES) ×3 IMPLANT
DRESSING AQUACEL AG SP 3.5X10 (GAUZE/BANDAGES/DRESSINGS) ×1 IMPLANT
DRSG AQUACEL AG SP 3.5X10 (GAUZE/BANDAGES/DRESSINGS) ×3
DURAPREP 26ML APPLICATOR (WOUND CARE) ×6 IMPLANT
ELECT REM PT RETURN 15FT ADLT (MISCELLANEOUS) ×3 IMPLANT
GLOVE BIOGEL M 7.0 STRL (GLOVE) IMPLANT
GLOVE BIOGEL PI IND STRL 7.5 (GLOVE) ×1 IMPLANT
GLOVE BIOGEL PI IND STRL 8.5 (GLOVE) ×1 IMPLANT
GLOVE BIOGEL PI INDICATOR 7.5 (GLOVE) ×2
GLOVE BIOGEL PI INDICATOR 8.5 (GLOVE) ×2
GLOVE ECLIPSE 8.0 STRL XLNG CF (GLOVE) ×3 IMPLANT
GLOVE ORTHO TXT STRL SZ7.5 (GLOVE) ×6 IMPLANT
GOWN STRL REUS W/TWL LRG LVL3 (GOWN DISPOSABLE) ×3 IMPLANT
GOWN STRL REUS W/TWL XL LVL3 (GOWN DISPOSABLE) ×3 IMPLANT
HANDPIECE INTERPULSE COAX TIP (DISPOSABLE) ×3
MANIFOLD NEPTUNE II (INSTRUMENTS) ×3 IMPLANT
NDL SAFETY ECLIPSE 18X1.5 (NEEDLE) IMPLANT
NEEDLE HYPO 18GX1.5 SHARP (NEEDLE) ×3
PACK TOTAL KNEE CUSTOM (KITS) ×3 IMPLANT
POSITIONER SURGICAL ARM (MISCELLANEOUS) ×3 IMPLANT
SET HNDPC FAN SPRY TIP SCT (DISPOSABLE) ×1 IMPLANT
SET PAD KNEE POSITIONER (MISCELLANEOUS) ×3 IMPLANT
SLEEVE SURGEON STRL (DRAPES) ×2 IMPLANT
SUT MNCRL AB 4-0 PS2 18 (SUTURE) ×3 IMPLANT
SUT STRATAFIX 1PDS 45CM VIOLET (SUTURE) IMPLANT
SUT STRATAFIX SPIRAL PDS+ 70CM (SUTURE)
SUT VIC AB 1 CT1 36 (SUTURE) ×5 IMPLANT
SUT VIC AB 2-0 CT1 27 (SUTURE) ×9
SUT VIC AB 2-0 CT1 TAPERPNT 27 (SUTURE) ×3 IMPLANT
SUTURE STRATFX SPIRL PDS+ 70CM (SUTURE) IMPLANT
SYR 3ML LL SCALE MARK (SYRINGE) ×2 IMPLANT
SYR 50ML LL SCALE MARK (SYRINGE) ×3 IMPLANT
TRAY FOLEY W/METER SILVER 16FR (SET/KITS/TRAYS/PACK) ×3 IMPLANT
WATER STERILE IRR 1000ML POUR (IV SOLUTION) ×5 IMPLANT
WRAP KNEE MAXI GEL POST OP (GAUZE/BANDAGES/DRESSINGS) ×3 IMPLANT
YANKAUER SUCT BULB TIP 10FT TU (MISCELLANEOUS) ×3 IMPLANT

## 2017-01-31 NOTE — Interval H&P Note (Signed)
History and Physical Interval Note:  01/31/2017 7:23 AM  Gregory Holt  has presented today for surgery, with the diagnosis of Left knee osteoarthritis  The various methods of treatment have been discussed with the patient and family. After consideration of risks, benefits and other options for treatment, the patient has consented to  Procedure(s) with comments: LEFT TOTAL KNEE ARTHROPLASTY (Left) - 90 mins as a surgical intervention .  The patient's history has been reviewed, patient examined, no change in status, stable for surgery.  I have reviewed the patient's chart and labs.  Questions were answered to the patient's satisfaction.     Mauri Pole

## 2017-01-31 NOTE — Op Note (Signed)
NAME:  Gregory Holt                      MEDICAL RECORD NO.:  [id number]                             FACILITY:  [facility]      PHYSICIAN:  Gregory Holt, M.D.  DATE OF BIRTH:  [birth year]      DATE OF PROCEDURE:  01/31/2017                                     OPERATIVE REPORT         PREOPERATIVE DIAGNOSIS:  Left knee osteoarthritis with history of prior ACL reconstruction with retained tibial screw     POSTOPERATIVE DIAGNOSIS:  Left knee osteoarthritis with history of prior ACL reconstruction with retained tibial screw      FINDINGS:  The patient was noted to have complete loss of cartilage and   bone-on-bone arthritis with associated osteophytes in all three compartments of   the knee with a significant synovitis and associated effusion.  Tibia screw impeded preparation of tibia so it needed to be removed     PROCEDURE:  Left total knee replacement.  #2 removal of hardware      COMPONENTS USED:  DePuy rotating platform posterior stabilized knee   system, a size 8 femur, 8 tibia, size 12 mm PS AOX insert, and 41 anatomic patellar   button.      SURGEON:  Gregory Holt, M.D.      ASSISTANT:  Gregory Orleans, PA-C.      ANESTHESIA:  Regional and Spinal.      SPECIMENS:  None.      COMPLICATION:  None.      DRAINS:  None.  EBL: <150cc      TOURNIQUET TIME:   Total Tourniquet Time Documented: Thigh (Left) - 50 minutes Total: Thigh (Left) - 50 minutes  .      The patient was stable to the recovery room.      INDICATION FOR PROCEDURE:  Gregory Holt is a 62 y.o. male patient of   mine.  The patient had been seen, evaluated, and treated conservatively in the   office with medication, activity modification, and injections.  The patient had   radiographic changes of bone-on-bone arthritis with endplate sclerosis and osteophytes noted.      The patient failed conservative measures including medication, injections, and activity modification, and at this point was ready  for more definitive measures.   Based on the radiographic changes and failed conservative measures, the patient   decided to proceed with total knee replacement.  Risks of infection,   DVT, component failure, need for revision surgery, postop course, and   expectations were all   discussed and reviewed.  Consent was obtained for benefit of pain   relief.      PROCEDURE IN DETAIL:  The patient was brought to the operative theater.   Once adequate anesthesia, preoperative antibiotics, 3 gm of Ancef, 1 gm of Tranexamic Acid, and 10 mg of Decadron administered, the patient was positioned supine with the left thigh tourniquet placed.  The  left lower extremity was prepped and draped in sterile fashion.  A time-   out was performed identifying the patient, planned procedure, and   extremity.  The left lower extremity was placed in the [facility] leg holder.  The leg was   exsanguinated, tourniquet elevated to 250 mmHg.  A midline incision was   made followed by median parapatellar arthrotomy.  Following initial   exposure, attention was first directed to the patella.  Precut   measurement was noted to be 27-28 mm.  I resected down to 16-17 mm and used a 41 anatomic patellar button to restore patellar height as well as cover the cut   surface.      The lug holes were drilled and a metal shim was placed to protect the   patella from retractors and saw blades.      At this point, attention was now directed to the femur.  The femoral   canal was opened with a drill, irrigated to try to prevent fat emboli.  An   intramedullary rod was passed at 5 degrees valgus, 9 mm of bone was   resected off the distal femur.  Following this resection, the tibia was   subluxated anteriorly.  Using the extramedullary guide, 2 mm of bone was resected off   the proximal medial tibia.  We confirmed the gap would be   stable medially and laterally with at least the size 7 mm spacer block as well as confirmed   the  cut was perpendicular in the coronal plane, checking with an alignment rod.      Once this was done, I sized the femur to be a size 8 in the anterior-   posterior dimension, chose a standard component based on medial and   lateral dimension.  The size 8 rotation block was then pinned in   position anterior referenced using the C-clamp to set rotation.  The   anterior, posterior, and  chamfer cuts were made without difficulty nor   notching making certain that I was along the anterior cortex to help   with flexion gap stability.  Posterior osteophytes and posterior capsule were removed and stripped due to radiographic findings and flexion contracture.     The final box cut was made off the lateral aspect of distal femur.      At this point, the tibia was sized to be a size 8, the size 8 tray was   then pinned in position through the medial third of the tubercle.  Once I began to drill for the keel the ACL screw was present.  It was identified and then removed from surrounding bone with osteotome.  It was removed without significant bone loss.  Once out the tray was re-positioned, drilled, and keel punched.  Trial reduction was now carried with a 8 femur,  8 tibia, then up to the size 12 mm PS insert (despite 5-10 degree pre-op flexion contracture), and the 41 anatomic patella botton.  The knee was brought to   extension, full extension with good flexion stability with the patella   tracking through the trochlea without application of pressure.  Given   all these findings the femoral lug holes were drilled and then the trial components removed.  Final components were   opened and cement was mixed.  The knee was irrigated with normal saline   solution and pulse lavage.  The synovial lining was   then injected with 30 cc of 0.25% Marcaine with epinephrine and 1 cc of Toradol plus 30 cc of NS for a total of 61 cc.      The knee was irrigated.  Final implants  were then cemented onto clean and    dried cut surfaces of bone with the knee brought to extension with a 12 mm PS trial insert.      Once the cement had fully cured, the excess cement was removed   throughout the knee.  I confirmed I was satisfied with the range of   motion and stability, and the final size 12 mm PS AOX insert was chosen.  It was   placed into the knee.      The tourniquet had been let down at 50 minutes.  No significant   hemostasis required.  The   extensor mechanism was then reapproximated using #1 Vicryl and #1 Stratafix sutures with the knee   in flexion.  The   remaining wound was closed with 2-0 Vicryl and running 4-0 Monocryl.   The knee was cleaned, dried, dressed sterilely using Dermabond and   Aquacel dressing.  The patient was then   brought to recovery room in stable condition, tolerating the procedure   well.   Please note that Physician Assistant, Gregory Orleans, PA-C, was present for the entirety of the case, and was utilized for pre-operative positioning, peri-operative retractor management, general facilitation of the procedure.  He was also utilized for primary wound closure at the end of the case.              Gregory Cassis Alvan Holt, M.D.    01/31/2017 9:19 AM

## 2017-01-31 NOTE — Transfer of Care (Signed)
Immediate Anesthesia Transfer of Care Note  Patient: Gregory Holt  Procedure(s) Performed: LEFT TOTAL KNEE ARTHROPLASTY (Left Knee)  Patient Location: PACU  Anesthesia Type:Spinal  Level of Consciousness: awake, alert  and oriented  Airway & Oxygen Therapy: Patient Spontanous Breathing and Patient connected to face mask oxygen  Post-op Assessment: Report given to RN  Post vital signs: Reviewed and stable  Last Vitals:  Vitals:   01/31/17 0603  BP: (!) 145/93  Pulse: 70  Resp: 18  Temp: 36.9 C  SpO2: 98%    Last Pain:  Vitals:   01/31/17 0603  TempSrc: Oral      Patients Stated Pain Goal: 4 (62/03/[birth year] 9741)  Complications: No apparent anesthesia complications

## 2017-01-31 NOTE — Addendum Note (Signed)
Addendum  created 01/31/17 1102 by Janeece Riggers, MD   Order list changed, Order sets accessed

## 2017-01-31 NOTE — Anesthesia Procedure Notes (Signed)
Anesthesia Regional Block: Adductor canal block   Pre-Anesthetic Checklist: ,, timeout performed, Correct Patient, Correct Site, Correct Laterality, Correct Procedure, Correct Position, site marked, Risks and benefits discussed,  Surgical consent,  Pre-op evaluation,  At surgeon's request and post-op pain management  Laterality: Left  Prep: chloraprep       Needles:  Injection technique: Single-shot  Needle Type: Echogenic Stimulator Needle     Needle Length: 5cm  Needle Gauge: 22     Additional Needles:   Procedures:, nerve stimulator,,, ultrasound used (permanent image in chart),,,,  Narrative:  Start time: 01/31/2017 6:55 AM End time: 01/31/2017 7:00 AM Injection made incrementally with aspirations every 5 mL.  Performed by: Personally  Anesthesiologist: Janeece Riggers, MD  Additional Notes: Functioning IV was confirmed and monitors were applied.  A 64mm 22ga Arrow echogenic stimulator needle was used. Sterile prep and drape,hand hygiene and sterile gloves were used. Ultrasound guidance: relevant anatomy identified, needle position confirmed, local anesthetic spread visualized around nerve(s)., vascular puncture avoided.  Image printed for medical record. Negative aspiration and negative test dose prior to incremental administration of local anesthetic. The patient tolerated the procedure well.

## 2017-01-31 NOTE — Anesthesia Postprocedure Evaluation (Signed)
Anesthesia Post Note  Patient: Gregory Holt  Procedure(s) Performed: LEFT TOTAL KNEE ARTHROPLASTY (Left Knee)     Patient location during evaluation: PACU Anesthesia Type: Spinal Level of consciousness: oriented and awake and alert Pain management: pain level controlled Vital Signs Assessment: post-procedure vital signs reviewed and stable Respiratory status: spontaneous breathing, respiratory function stable and patient connected to nasal cannula oxygen Cardiovascular status: blood pressure returned to baseline and stable Postop Assessment: no headache, no backache and no apparent nausea or vomiting Anesthetic complications: no    Last Vitals:  Vitals:   01/31/17 0603  BP: (!) 145/93  Pulse: 70  Resp: 18  Temp: 36.9 C  SpO2: 98%    Last Pain:  Vitals:   01/31/17 0603  TempSrc: Oral                 Abron Neddo

## 2017-01-31 NOTE — Progress Notes (Signed)
Applied one gtt ketorolac soluton to per instructions.

## 2017-01-31 NOTE — Anesthesia Procedure Notes (Signed)
Spinal  Patient location during procedure: OR Start time: 01/31/2017 7:34 AM End time: 01/31/2017 7:36 AM Staffing Anesthesiologist: Janeece Riggers, MD Preanesthetic Checklist Completed: patient identified, site marked, surgical consent, pre-op evaluation, timeout performed, IV checked, risks and benefits discussed and monitors and equipment checked Spinal Block Patient position: sitting Prep: DuraPrep Patient monitoring: heart rate, cardiac monitor, continuous pulse ox and blood pressure Approach: midline Location: L4-5 Injection technique: single-shot Needle Needle type: Sprotte  Needle gauge: 24 G Needle length: 9 cm Assessment Sensory level: T4

## 2017-01-31 NOTE — Evaluation (Signed)
Physical Therapy Evaluation Patient Details Name: Gregory Holt MRN: [id number] DOB: [birth year] Today's Date: 01/31/2017   History of Present Illness  LTKA  Clinical Impression  The patient ambulated x 120'. Plans Dc tomorrow. Pt admitted with above diagnosis. Pt currently with functional limitations due to the deficits listed below (see PT Problem List). Pt will benefit from skilled PT to increase their independence and safety with mobility to allow discharge to the venue listed below.       Follow Up Recommendations DC plan and follow up therapy as arranged by surgeon    Equipment Recommendations  None recommended by PT    Recommendations for Other Services OT consult     Precautions / Restrictions Precautions Precautions: Knee      Mobility  Bed Mobility Overal bed mobility: Needs Assistance Bed Mobility: Supine to Sit;Sit to Supine     Supine to sit: Min assist Sit to supine: Min assist   General bed mobility comments: assist with left leg  Transfers Overall transfer level: Needs assistance Equipment used: Rolling walker (2 wheeled) Transfers: Sit to/from Stand Sit to Stand: Min assist         General transfer comment: cues for hand and left leg position  Ambulation/Gait Ambulation/Gait assistance: Min assist Ambulation Distance (Feet): 120 Feet Assistive device: Rolling walker (2 wheeled) Gait Pattern/deviations: Step-to pattern;Step-through pattern;Antalgic     General Gait Details: cues for sequence  Stairs            Wheelchair Mobility    Modified Rankin (Stroke Patients Only)       Balance                                             Pertinent Vitals/Pain Pain Assessment: 0-10 Pain Score: 4  Pain Location: left knee Pain Descriptors / Indicators: Aching;Tender Pain Intervention(s): Premedicated before session;Ice applied;Monitored during session    Home Living Family/patient expects to be discharged to::  Private residence Living Arrangements: Spouse/significant other Available Help at Discharge: Family Type of Home: House Home Access: Stairs to enter   Technical brewer of Steps: 2 Home Layout: 1/2 bath on main level;Two level Home Equipment: Walker - 2 wheels;Crutches      Prior Function Level of Independence: Independent               Hand Dominance        Extremity/Trunk Assessment        Lower Extremity Assessment Lower Extremity Assessment: LLE deficits/detail LLE Deficits / Details: performs SLR w/ lag    Cervical / Trunk Assessment Cervical / Trunk Assessment: Normal  Communication   Communication: No difficulties  Cognition Arousal/Alertness: Awake/alert Behavior During Therapy: WFL for tasks assessed/performed Overall Cognitive Status: Within Functional Limits for tasks assessed                                        General Comments      Exercises Total Joint Exercises Quad Sets: AROM;Left;10 reps   Assessment/Plan    PT Assessment Patient needs continued PT services  PT Problem List Decreased strength;Decreased range of motion;Decreased activity tolerance;Decreased mobility;Decreased knowledge of precautions;Decreased safety awareness;Decreased knowledge of use of DME;Pain       PT Treatment Interventions DME instruction;Therapeutic exercise;Gait training;Stair training;Functional mobility training;Therapeutic activities;Patient/family education  PT Goals (Current goals can be found in the Care Plan section)  Acute Rehab PT Goals Patient Stated Goal: to walk witout pain PT Goal Formulation: With patient Time For Goal Achievement: 02/03/17 Potential to Achieve Goals: Good    Frequency 7X/week   Barriers to discharge        Co-evaluation               AM-PAC PT "6 Clicks" Daily Activity  Outcome Measure Difficulty turning over in bed (including adjusting bedclothes, sheets and blankets)?: A  Little Difficulty moving from lying on back to sitting on the side of the bed? : A Little Difficulty sitting down on and standing up from a chair with arms (e.g., wheelchair, bedside commode, etc,.)?: A Little Help needed moving to and from a bed to chair (including a wheelchair)?: A Little Help needed walking in hospital room?: A Little Help needed climbing 3-5 steps with a railing? : A Lot 6 Click Score: 17    End of Session   Activity Tolerance: Patient tolerated treatment well Patient left: in bed;with call bell/phone within reach Nurse Communication: Mobility status PT Visit Diagnosis: Unsteadiness on feet (R26.81)    Time: 5003-7048 PT Time Calculation (min) (ACUTE ONLY): 11 min   Charges:   PT Evaluation $PT Eval Low Complexity: 1 Low     PT G CodesTresa Endo PT [phone]   Claretha Cooper 01/31/2017, 5:43 PM

## 2017-01-31 NOTE — Discharge Instructions (Signed)

## 2017-01-31 NOTE — Progress Notes (Signed)
Dr informed pt c/o Lt eye irritation, mild pain.

## 2017-01-31 NOTE — Progress Notes (Signed)
Dr Eligha Bridegroom at bedside to eval pt's Lt eye. Order received to initiate corneal abrasion protocol.

## 2017-02-01 DIAGNOSIS — M1712 Unilateral primary osteoarthritis, left knee: Secondary | ICD-10-CM | POA: Diagnosis not present

## 2017-02-01 LAB — CBC
HCT: 37.9 % — ABNORMAL LOW (ref 39.0–52.0)
HEMOGLOBIN: 12.6 g/dL — AB (ref 13.0–17.0)
MCH: 30.5 pg (ref 26.0–34.0)
MCHC: 33.2 g/dL (ref 30.0–36.0)
MCV: 91.8 fL (ref 78.0–100.0)
Platelets: 199 10*3/uL (ref 150–400)
RBC: 4.13 MIL/uL — AB (ref 4.22–5.81)
RDW: 12.3 % (ref 11.5–15.5)
WBC: 11.8 10*3/uL — ABNORMAL HIGH (ref 4.0–10.5)

## 2017-02-01 LAB — BASIC METABOLIC PANEL
Anion gap: 4 — ABNORMAL LOW (ref 5–15)
BUN: 23 mg/dL — AB (ref 6–20)
CHLORIDE: 105 mmol/L (ref 101–111)
CO2: 26 mmol/L (ref 22–32)
CREATININE: 0.69 mg/dL (ref 0.61–1.24)
Calcium: 8.6 mg/dL — ABNORMAL LOW (ref 8.9–10.3)
GFR calc Af Amer: >60 mL/min (ref >60)
GFR calc non Af Amer: >60 mL/min (ref >60)
GLUCOSE: 137 mg/dL — AB (ref 65–99)
POTASSIUM: 4.1 mmol/L (ref 3.5–5.1)
Sodium: 135 mmol/L (ref 135–145)

## 2017-02-01 NOTE — Plan of Care (Signed)
Plan of care reviewed and discussed with patient and spouse. 

## 2017-02-01 NOTE — Discharge Summary (Signed)
Physician Discharge Summary  Patient ID: Gregory Holt MRN: [id number] DOB/AGE: 1955-09-05 62 y.o.  Admit date: 01/31/2017 Discharge date: 02/01/2017   Procedures:  Procedure(s) (LRB): LEFT TOTAL KNEE ARTHROPLASTY (Left)  Attending Physician:  Dr. Paralee Cancel   Admission Diagnoses:    Left knee primary OA / pain  Discharge Diagnoses:  Principal Problem:   S/P left TKA  Past Medical History:  Diagnosis Date  . Arthritis   . Cancer ([place])    basal cell skin cancer   . Hyperlipidemia   . Hypertension     HPI:    Gregory Holt, 62 y.o. male, has a history of pain and functional disability in the left knee due to arthritis and has failed non-surgical conservative treatments for greater than 12 weeks to include NSAID's and/or analgesics, corticosteriod injections and activity modification.  Onset of symptoms was gradual, starting 4+ years ago with gradually worsening course since that time. The patient noted prior procedures on the knee to include  ACL reconstruction on the left knee(s).  Patient currently rates pain in the left knee(s) at 10 out of 10 with activity. Patient has worsening of pain with activity and weight bearing, pain that interferes with activities of daily living, pain with passive range of motion, crepitus and joint swelling.  Patient has evidence of periarticular osteophytes and joint space narrowing by imaging studies.  There is no active infection.  Risks, benefits and expectations were discussed with the patient.  Risks including but not limited to the risk of anesthesia, blood clots, nerve damage, blood vessel damage, failure of the prosthesis, infection and up to and including death.  Patient understand the risks, benefits and expectations and wishes to proceed with surgery.   PCP: Maury Dus, MD   Discharged Condition: good  Hospital Course:  Patient underwent the above stated procedure on 01/31/2017. Patient tolerated the procedure well and brought to  the recovery room in good condition and subsequently to the floor.  POD #1 BP: 123/65 ; Pulse: 66 ; Temp: 97.8 F (36.6 C) ; Resp: 17 Patient reports pain as mild to moderate.  No events over night.  Ready to be discharged home. Neurovascular intact and incision: dressing C/D/I.   LABS  Basename    HGB     12.6  HCT     37.9    Discharge Exam: General appearance: alert, cooperative and no distress Extremities: Homans sign is negative, no sign of DVT, no edema, redness or tenderness in the calves or thighs and no ulcers, gangrene or trophic changes  Disposition: Home with follow up in 2 weeks   Follow-up Information    Paralee Cancel, MD. Schedule an appointment as soon as possible for a visit in 2 week(s).   Specialty:  Orthopedic Surgery Contact information: [address] [place] [address] [phone]           Discharge Instructions    Call MD / Call 911   Complete by:  As directed    If you experience chest pain or shortness of breath, CALL 911 and be transported to the hospital emergency room.  If you develope a fever above 101 F, pus (white drainage) or increased drainage or redness at the wound, or calf pain, call your surgeon's office.   Change dressing   Complete by:  As directed    Maintain surgical dressing until follow up in the clinic. If the edges start to pull up, may reinforce with tape. If the dressing is  no longer working, may remove and cover with gauze and tape, but must keep the area dry and clean.  Call with any questions or concerns.   Constipation Prevention   Complete by:  As directed    Drink plenty of fluids.  Prune juice may be helpful.  You may use a stool softener, such as Colace (over the counter) 100 mg twice a day.  Use MiraLax (over the counter) for constipation as needed.   Diet - low sodium heart healthy   Complete by:  As directed    Discharge instructions   Complete by:  As directed    Maintain surgical dressing  until follow up in the clinic. If the edges start to pull up, may reinforce with tape. If the dressing is no longer working, may remove and cover with gauze and tape, but must keep the area dry and clean.  Follow up in 2 weeks at [facility]. Call with any questions or concerns.   Increase activity slowly as tolerated   Complete by:  As directed    Weight bearing as tolerated with assist device (walker, cane, etc) as directed, use it as long as suggested by your surgeon or therapist, typically at least 4-6 weeks.   TED hose   Complete by:  As directed    Use stockings (TED hose) for 2 weeks on both leg(s).  You may remove them at night for sleeping.      Allergies as of 02/01/2017      Reactions   Oxycodone Itching      Medication List    STOP taking these medications   acetaminophen 500 MG tablet Commonly known as:  TYLENOL   ibuprofen 800 MG tablet Commonly known as:  ADVIL,MOTRIN   Ibuprofen-Famotidine 800-26.6 MG Tabs     TAKE these medications   ALCORTIN A EX Apply 1 application topically daily as needed (rash).   amLODipine 5 MG tablet Commonly known as:  NORVASC Take 5 mg by mouth daily.   aspirin 81 MG chewable tablet Commonly known as:  ASPIRIN CHILDRENS Chew 1 tablet (81 mg total) by mouth 2 (two) times daily. Take for 4 weeks, then resume regular dose.   docusate sodium 100 MG capsule Commonly known as:  COLACE Take 1 capsule (100 mg total) by mouth 2 (two) times daily.   ferrous sulfate 325 (65 FE) MG tablet Commonly known as:  FERROUSUL Take 1 tablet (325 mg total) by mouth 3 (three) times daily with meals.   HYDROcodone-acetaminophen 7.5-325 MG tablet Commonly known as:  NORCO Take 1-2 tablets by mouth every 4 (four) hours as needed for moderate pain.   losartan-hydrochlorothiazide 100-25 MG tablet Commonly known as:  HYZAAR Take 1 tablet by mouth daily.   methocarbamol 500 MG tablet Commonly known as:  ROBAXIN Take 1 tablet (500 mg  total) by mouth every 6 (six) hours as needed for muscle spasms.   polyethylene glycol packet Commonly known as:  MIRALAX / GLYCOLAX Take 17 g by mouth 2 (two) times daily.            Discharge Care Instructions  (From admission, onward)        Start     Ordered   02/01/17 0000  Change dressing    Comments:  Maintain surgical dressing until follow up in the clinic. If the edges start to pull up, may reinforce with tape. If the dressing is no longer working, may remove and cover with gauze and tape, but must  keep the area dry and clean.  Call with any questions or concerns.   02/01/17 1209       Signed: [place] Pugh. Reilynn Lauro   PA-C  02/01/2017, 3:26 PM

## 2017-02-01 NOTE — Evaluation (Signed)
Occupational Therapy Evaluation Patient Details Name: Gregory Holt MRN: [id number] DOB: 06/12/1955 Today's Date: 02/01/2017    History of Present Illness LTKA   Clinical Impression   Pt admitted as above and is currently supervision-Min guard assist for LB ADL's and functional transfers. He performed toilet and shower transfers x2 using RW and supervision/min guard assist for safety. Pt plans to have PRN family assist at d/c. No further acute OT needs were identified at this time, will sign off.    Follow Up Recommendations  No OT follow up;Supervision - Intermittent    Equipment Recommendations  None recommended by OT(Discussed A/E and pt does not currently feel he needs this)    Recommendations for Other Services       Precautions / Restrictions Precautions Precautions: Knee Restrictions Weight Bearing Restrictions: No      Mobility Bed Mobility Overal bed mobility: Needs Assistance Bed Mobility: Supine to Sit     Supine to sit: Modified independent (Device/Increase time)     General bed mobility comments: No physical assist, increased time for task  Transfers Overall transfer level: Needs assistance Equipment used: Rolling walker (2 wheeled) Transfers: Sit to/from [facility] Sit to Stand: Supervision Stand pivot transfers: Supervision;Min guard       General transfer comment: VC's for RW use during functional ambulation/transfers    Balance Overall balance assessment: No apparent balance deficits (not formally assessed)                                         ADL either performed or assessed with clinical judgement   ADL Overall ADL's : Needs assistance/impaired Eating/Feeding: Independent;Sitting   Grooming: Modified independent;Sitting   Upper Body Bathing: Sitting;Set up   Lower Body Bathing: Min guard;Sit to/from stand   Upper Body Dressing : Sitting;Modified independent   Lower Body Dressing:  Supervision/safety;Sit to/from stand Lower Body Dressing Details (indicate cue type and reason): Pt demonstrated don/doff socks sitting up in recliner. Discussed A/E w/ pt/spouse, family is able to assist PRN Toilet Transfer: Supervision/safety;RW;Ambulation Armed forces technical officer Details (indicate cue type and reason): Pt has 3:1 at home that he borrowed from a friend. Pt transfered to toilet using RW and sink for support as he has vanity nearby at home [place] and Hygiene: Supervision/safety;Sit to/from stand   Tub/ Shower Transfer: Walk-in shower;Supervision/safety;Anterior/posterior;Ambulation;Rolling walker Tub/Shower Transfer Details (indicate cue type and reason): Pt performed shower transfer x2 with supervision/min guard assist. Discussed shower transfers/technique with pt/spouse Functional mobility during ADLs: Supervision/safety;Rolling walker;Cueing for sequencing(Cues to not lift RW while ambulating) General ADL Comments: Pt was seen acute OT asssessment followed by ADL retraining session with focus on toilet transfers, shower transfers x2 w/ RW; LB dressing techniques and A/E was discussed as well. Pt was able to don/doff socks w/o use of A/E and spouse will be able to provide PRN assist per their report. No further acute OT needs identified at this time, will sign off.     Vision Baseline Vision/History: Wears glasses(Glasses or contacts) Wears Glasses: At all times Patient Visual Report: No change from baseline       Perception     Praxis      Pertinent Vitals/Pain Pain Assessment: 0-10 Pain Score: 4  Pain Location: left knee Pain Descriptors / Indicators: Aching;Tender Pain Intervention(s): Limited activity within patient's tolerance;Monitored during session;Repositioned;Premedicated before session     Hand Dominance Right  Extremity/Trunk Assessment Upper Extremity Assessment Upper Extremity Assessment: Overall WFL for tasks assessed(Previous  right shoulder surgery ~ 2 years ago)   Lower Extremity Assessment Lower Extremity Assessment: Defer to PT evaluation   Cervical / Trunk Assessment Cervical / Trunk Assessment: Normal   Communication Communication Communication: No difficulties   Cognition Arousal/Alertness: Awake/alert Behavior During Therapy: WFL for tasks assessed/performed Overall Cognitive Status: Within Functional Limits for tasks assessed                                     General Comments       Exercises     Shoulder Instructions      Home Living Family/patient expects to be discharged to:: Private residence Living Arrangements: Spouse/significant other Available Help at Discharge: Family Type of Home: House Home Access: Stairs to enter Technical brewer of Steps: 2   Home Layout: 1/2 bath on main level;Two level Alternate Level Stairs-Number of Steps: 14 Alternate Level Stairs-Rails: Right Bathroom Shower/Tub: Walk-in shower;Curtain   Bathroom Toilet: Standard     Home Equipment: Environmental consultant - 2 wheels;Crutches;Bedside commode          Prior Functioning/Environment Level of Independence: Independent                 OT Problem List: Pain      OT Treatment/Interventions:      OT Goals(Current goals can be found in the care plan section) Acute Rehab OT Goals Patient Stated Goal: Go home later today OT Goal Formulation: All assessment and education complete, DC therapy  OT Frequency:     Barriers to D/C:            Co-evaluation              AM-PAC PT "6 Clicks" Daily Activity     Outcome Measure Help from another person eating meals?: None Help from another person taking care of personal grooming?: A Little Help from another person toileting, which includes using toliet, bedpan, or urinal?: A Little Help from another person bathing (including washing, rinsing, drying)?: A Little Help from another person to put on and taking off regular upper body  clothing?: None Help from another person to put on and taking off regular lower body clothing?: A Little 6 Click Score: 20   End of Session Equipment Utilized During Treatment: Gait belt;Rolling walker Nurse Communication: Other (comment)(Pt/Family inquiring about shower )  Activity Tolerance: Patient tolerated treatment well Patient left: in chair;with call bell/phone within reach;with nursing/sitter in room;with family/visitor present  OT Visit Diagnosis: Pain Pain - Right/Left: Left Pain - part of body: Knee                Time: 0938-1829 OT Time Calculation (min): 31 min Charges:  OT General Charges $OT Visit: 1 Visit OT Evaluation $OT Eval Low Complexity: 1 Low OT Treatments $Self Care/Home Management : 8-22 mins G-Codes:      Josephine Igo Dixon, OTR/L 02/01/2017, 8:52 AM

## 2017-02-01 NOTE — Progress Notes (Signed)
Patient ID: Gregory Holt, male   DOB: Oct 09, 1955, 62 y.o.   MRN: [id number] Subjective: 1 Day Post-Op Procedure(s) (LRB): LEFT TOTAL KNEE ARTHROPLASTY (Left)    Patient reports pain as mild to moderate.  No events over night  Objective:   VITALS:   Vitals:   02/01/17 0207 02/01/17 0528  BP: 126/73 123/65  Pulse: 66 66  Resp: 17 17  Temp: 97.9 F (36.6 C) 97.8 F (36.6 C)  SpO2: 99% 98%    Neurovascular intact Incision: dressing C/D/I  LABS Recent Labs    02/01/17 0548  HGB 12.6*  HCT 37.9*  WBC 11.8*  PLT 199    Recent Labs    02/01/17 0548  NA 135  K 4.1  BUN 23*  CREATININE 0.69  GLUCOSE 137*    No results for input(s): LABPT, INR in the last 72 hours.   Assessment/Plan: 1 Day Post-Op Procedure(s) (LRB): LEFT TOTAL KNEE ARTHROPLASTY (Left)   Advance diet Up with therapy   Home today after therapy Reviewed operative findings, procedure, and goals outpt PT already set up RTC in 2 weeks

## 2017-02-01 NOTE — Progress Notes (Signed)
Physical Therapy Treatment Patient Details Name: Gregory Holt MRN: [id number] DOB: 30-Jun-1955 Today's Date: 02/01/2017    History of Present Illness LTKA    PT Comments    Patient is ready for DC.   Follow Up Recommendations  DC plan and follow up therapy as arranged by surgeon     Equipment Recommendations  None recommended by PT    Recommendations for Other Services       Precautions / Restrictions Precautions Precautions: Knee    Mobility  Bed Mobility Overal bed mobility: Modified Independent                Transfers Overall transfer level: Modified independent Equipment used: Rolling walker (2 wheeled) Transfers: Sit to/from Stand   Stand pivot transfers: Supervision          Ambulation/Gait Ambulation/Gait assistance: Modified independent (Device/Increase time) Ambulation Distance (Feet): 600 Feet Assistive device: Rolling walker (2 wheeled) Gait Pattern/deviations: Step-through pattern     General Gait Details: cues for sequence   Stairs Stairs: Yes   Stair Management: One rail Left;One rail Right;Forwards;With crutches Number of Stairs: 4 General stair comments: practiced 4 steps with l then right rail and 1 crutch  Wheelchair Mobility    Modified Rankin (Stroke Patients Only)       Balance                                            Cognition Arousal/Alertness: Awake/alert                                            Exercises    General Comments        Pertinent Vitals/Pain Pain Score: 2  Pain Location: left knee Pain Descriptors / Indicators: Aching;Tender Pain Intervention(s): Monitored during session;Premedicated before session    Home Living                      Prior Function            PT Goals (current goals can now be found in the care plan section) Progress towards PT goals: Progressing toward goals    Frequency    7X/week      PT Plan Current  plan remains appropriate    Co-evaluation              AM-PAC PT "6 Clicks" Daily Activity  Outcome Measure  Difficulty turning over in bed (including adjusting bedclothes, sheets and blankets)?: None Difficulty moving from lying on back to sitting on the side of the bed? : None Difficulty sitting down on and standing up from a chair with arms (e.g., wheelchair, bedside commode, etc,.)?: None Help needed moving to and from a bed to chair (including a wheelchair)?: None Help needed walking in hospital room?: None Help needed climbing 3-5 steps with a railing? : A Little 6 Click Score: 23    End of Session   Activity Tolerance: Patient tolerated treatment well Patient left: in bed Nurse Communication: Mobility status PT Visit Diagnosis: Unsteadiness on feet (R26.81)     Time: 1320-1340 PT Time Calculation (min) (ACUTE ONLY): 20 min  Charges:  $Gait Training: 8-22 mins  G Codes:          Claretha Cooper 02/01/2017, 1:54 PM

## 2017-02-01 NOTE — Progress Notes (Signed)
Physical Therapy Treatment Patient Details Name: Gregory Holt MRN: [id number] DOB: 1955-02-25 Today's Date: 02/01/2017    History of Present Illness LTKA    PT Comments    The patient is progressing well. Plans DC after PT   Follow Up Recommendations  DC plan and follow up therapy as arranged by surgeon     Equipment Recommendations  None recommended by PT    Recommendations for Other Services       Precautions / Restrictions Precautions Precautions: Knee    Mobility  Bed Mobility Overal bed mobility: Modified Independent Bed Mobility: Supine to Sit     Supine to sit: Modified independent (Device/Increase time)     General bed mobility comments: No physical assist, increased time for task  Transfers Overall transfer level: Needs assistance Equipment used: Rolling walker (2 wheeled) Transfers: Sit to/from Stand Sit to Stand: Supervision Stand pivot transfers: Supervision       General transfer comment: VC's for RW use during functional ambulation/transfers  Ambulation/Gait Ambulation/Gait assistance: Supervision Ambulation Distance (Feet): 250 Feet Assistive device: Rolling walker (2 wheeled) Gait Pattern/deviations: Step-through pattern     General Gait Details: cues for sequence   Stairs            Wheelchair Mobility    Modified Rankin (Stroke Patients Only)       Balance Overall balance assessment: No apparent balance deficits (not formally assessed)                                          Cognition Arousal/Alertness: Awake/alert                                            Exercises Total Joint Exercises Ankle Circles/Pumps: AROM;Both Quad Sets: AROM;Both Towel Squeeze: AROM;Left Short Arc Quad: AROM;Left Heel Slides: AROM;Left Hip ABduction/ADduction: AROM;Left Straight Leg Raises: AROM;Left Long Arc Quad: AROM;Left Knee Flexion: AROM;Left Goniometric ROM: 10-70 left knee     General Comments        Pertinent Vitals/Pain Pain Score: 5  Pain Location: left knee Pain Descriptors / Indicators: Aching;Tender Pain Intervention(s): Monitored during session;Premedicated before session;Ice applied    Home Living                      Prior Function            PT Goals (current goals can now be found in the care plan section) Acute Rehab PT Goals Patient Stated Goal: Go home later today Progress towards PT goals: Progressing toward goals    Frequency    7X/week      PT Plan Current plan remains appropriate    Co-evaluation              AM-PAC PT "6 Clicks" Daily Activity  Outcome Measure  Difficulty turning over in bed (including adjusting bedclothes, sheets and blankets)?: None Difficulty moving from lying on back to sitting on the side of the bed? : None Difficulty sitting down on and standing up from a chair with arms (e.g., wheelchair, bedside commode, etc,.)?: None Help needed moving to and from a bed to chair (including a wheelchair)?: A Little Help needed walking in hospital room?: A Little Help needed climbing 3-5 steps with a railing? : A Lot 6  Click Score: 20    End of Session   Activity Tolerance: Patient tolerated treatment well Patient left: in bed;with call bell/phone within reach Nurse Communication: Mobility status       Time: 3086-5784 PT Time Calculation (min) (ACUTE ONLY): 47 min  Charges:  $Gait Training: 8-22 mins $Therapeutic Exercise: 8-22 mins $Self Care/Home Management: 8-22                    G CodesTresa Endo PT [phone]    Claretha Cooper 02/01/2017, 12:45 PM

## 2017-02-03 DIAGNOSIS — M25662 Stiffness of left knee, not elsewhere classified: Secondary | ICD-10-CM | POA: Diagnosis not present

## 2017-02-08 DIAGNOSIS — M25662 Stiffness of left knee, not elsewhere classified: Secondary | ICD-10-CM | POA: Diagnosis not present

## 2017-02-11 DIAGNOSIS — M25662 Stiffness of left knee, not elsewhere classified: Secondary | ICD-10-CM | POA: Diagnosis not present

## 2017-02-15 DIAGNOSIS — M25662 Stiffness of left knee, not elsewhere classified: Secondary | ICD-10-CM | POA: Diagnosis not present

## 2017-02-18 DIAGNOSIS — M25662 Stiffness of left knee, not elsewhere classified: Secondary | ICD-10-CM | POA: Diagnosis not present

## 2017-02-22 DIAGNOSIS — M25662 Stiffness of left knee, not elsewhere classified: Secondary | ICD-10-CM | POA: Diagnosis not present

## 2017-02-25 DIAGNOSIS — M25662 Stiffness of left knee, not elsewhere classified: Secondary | ICD-10-CM | POA: Diagnosis not present

## 2017-03-01 DIAGNOSIS — M25662 Stiffness of left knee, not elsewhere classified: Secondary | ICD-10-CM | POA: Diagnosis not present

## 2017-03-04 DIAGNOSIS — M25662 Stiffness of left knee, not elsewhere classified: Secondary | ICD-10-CM | POA: Diagnosis not present

## 2017-03-08 DIAGNOSIS — M25662 Stiffness of left knee, not elsewhere classified: Secondary | ICD-10-CM | POA: Diagnosis not present

## 2017-03-15 DIAGNOSIS — M25662 Stiffness of left knee, not elsewhere classified: Secondary | ICD-10-CM | POA: Diagnosis not present

## 2017-03-16 DIAGNOSIS — Z471 Aftercare following joint replacement surgery: Secondary | ICD-10-CM | POA: Diagnosis not present

## 2017-03-16 DIAGNOSIS — M7062 Trochanteric bursitis, left hip: Secondary | ICD-10-CM | POA: Diagnosis not present

## 2017-03-16 DIAGNOSIS — Z96652 Presence of left artificial knee joint: Secondary | ICD-10-CM | POA: Diagnosis not present

## 2017-03-18 DIAGNOSIS — M25662 Stiffness of left knee, not elsewhere classified: Secondary | ICD-10-CM | POA: Diagnosis not present

## 2017-03-22 DIAGNOSIS — M25662 Stiffness of left knee, not elsewhere classified: Secondary | ICD-10-CM | POA: Diagnosis not present

## 2017-03-25 DIAGNOSIS — M25662 Stiffness of left knee, not elsewhere classified: Secondary | ICD-10-CM | POA: Diagnosis not present

## 2017-03-29 DIAGNOSIS — M25662 Stiffness of left knee, not elsewhere classified: Secondary | ICD-10-CM | POA: Diagnosis not present

## 2017-04-01 DIAGNOSIS — M25662 Stiffness of left knee, not elsewhere classified: Secondary | ICD-10-CM | POA: Diagnosis not present

## 2017-04-21 DIAGNOSIS — M25562 Pain in left knee: Secondary | ICD-10-CM | POA: Diagnosis not present

## 2017-04-21 DIAGNOSIS — E7800 Pure hypercholesterolemia, unspecified: Secondary | ICD-10-CM | POA: Diagnosis not present

## 2017-04-21 DIAGNOSIS — Z0000 Encounter for general adult medical examination without abnormal findings: Secondary | ICD-10-CM | POA: Diagnosis not present

## 2017-04-21 DIAGNOSIS — I10 Essential (primary) hypertension: Secondary | ICD-10-CM | POA: Diagnosis not present

## 2017-05-18 DIAGNOSIS — D0462 Carcinoma in situ of skin of left upper limb, including shoulder: Secondary | ICD-10-CM | POA: Diagnosis not present

## 2017-05-18 DIAGNOSIS — L821 Other seborrheic keratosis: Secondary | ICD-10-CM | POA: Diagnosis not present

## 2017-05-18 DIAGNOSIS — L570 Actinic keratosis: Secondary | ICD-10-CM | POA: Diagnosis not present

## 2017-05-18 DIAGNOSIS — D048 Carcinoma in situ of skin of other sites: Secondary | ICD-10-CM | POA: Diagnosis not present

## 2017-06-10 DIAGNOSIS — D0462 Carcinoma in situ of skin of left upper limb, including shoulder: Secondary | ICD-10-CM | POA: Diagnosis not present

## 2017-06-21 MED FILL — AMOXICILLIN 500 MG CAPSULE: 500 | 2 days supply | Qty: 8 | Fill #0

## 2017-09-20 DIAGNOSIS — L570 Actinic keratosis: Secondary | ICD-10-CM | POA: Diagnosis not present

## 2017-09-20 DIAGNOSIS — Z85828 Personal history of other malignant neoplasm of skin: Secondary | ICD-10-CM | POA: Diagnosis not present

## 2017-09-20 DIAGNOSIS — D485 Neoplasm of uncertain behavior of skin: Secondary | ICD-10-CM | POA: Diagnosis not present

## 2017-09-23 DIAGNOSIS — H00021 Hordeolum internum right upper eyelid: Secondary | ICD-10-CM | POA: Diagnosis not present

## 2017-12-16 ENCOUNTER — Encounter: Payer: Self-pay | Admitting: Podiatry

## 2017-12-16 ENCOUNTER — Ambulatory Visit: Payer: 59 | Admitting: Podiatry

## 2017-12-16 ENCOUNTER — Other Ambulatory Visit: Payer: Self-pay | Admitting: Podiatry

## 2017-12-16 ENCOUNTER — Ambulatory Visit (INDEPENDENT_AMBULATORY_CARE_PROVIDER_SITE_OTHER): Payer: 59

## 2017-12-16 DIAGNOSIS — M25571 Pain in right ankle and joints of right foot: Secondary | ICD-10-CM | POA: Diagnosis not present

## 2017-12-16 DIAGNOSIS — M779 Enthesopathy, unspecified: Secondary | ICD-10-CM

## 2017-12-16 MED ORDER — TRIAMCINOLONE ACETONIDE 10 MG/ML IJ SUSP
10.0000 mg | Freq: Once | INTRAMUSCULAR | Status: AC
  Administered 2017-12-16: 10 mg

## 2017-12-18 NOTE — Progress Notes (Signed)
Subjective:   Patient ID: Gregory Holt, male   DOB: 62 y.o.   MRN: [id number]   HPI Patient presents stating he is got a lot of pain on top of his right foot for the last few months and does not remember specific injury.  States it hard for him to walk and be comfortable   ROS      Objective:  Physical Exam  Neurovascular status found to be intact muscle strength is adequate with patient having had most likely sprained right ankle with moderate discomfort on the anterior talofibular ligament calcaneofibular ligament.  There is acute inflammation of the sinus tarsi right with fluid buildup and into the ankle joint itself and patient has had partial knee replacement which may be part of the pathology he is experiencing     Assessment:  Acute capsulitis dorsal ankle sinus tarsi right along with sprained right ankle which may be secondary or separate problem due to his change in gait     Plan:  Reviewed the implant and discussed the change in gait he is experienced along with the swelling he is continuing to experience and today I went ahead and injected the sinus tarsi ankle joint 3 mg Kenalog 5 mg Xylocaine advised on reduced activity and reappoint if symptoms persist  X-rays were negative for signs for fracture or diastases injury of the ankle signed visit

## 2018-05-03 DIAGNOSIS — E7800 Pure hypercholesterolemia, unspecified: Secondary | ICD-10-CM | POA: Diagnosis not present

## 2018-05-03 DIAGNOSIS — M25562 Pain in left knee: Secondary | ICD-10-CM | POA: Diagnosis not present

## 2018-05-03 DIAGNOSIS — I10 Essential (primary) hypertension: Secondary | ICD-10-CM | POA: Diagnosis not present

## 2018-05-09 DIAGNOSIS — E7800 Pure hypercholesterolemia, unspecified: Secondary | ICD-10-CM | POA: Diagnosis not present

## 2018-05-09 DIAGNOSIS — Z125 Encounter for screening for malignant neoplasm of prostate: Secondary | ICD-10-CM | POA: Diagnosis not present

## 2018-05-09 DIAGNOSIS — E291 Testicular hypofunction: Secondary | ICD-10-CM | POA: Diagnosis not present

## 2018-05-09 DIAGNOSIS — I10 Essential (primary) hypertension: Secondary | ICD-10-CM | POA: Diagnosis not present

## 2018-07-19 MED FILL — AMOXICILLIN 500 MG CAPSULE: 500 | 2 days supply | Qty: 8 | Fill #0

## 2018-10-27 ENCOUNTER — Other Ambulatory Visit: Payer: Self-pay

## 2018-10-27 DIAGNOSIS — Z20822 Contact with and (suspected) exposure to covid-19: Secondary | ICD-10-CM

## 2018-10-29 LAB — NOVEL CORONAVIRUS, NAA: SARS-CoV-2, NAA: NOT DETECTED

## 2019-03-27 MED FILL — GAVILYTE-G SOLUTION: 236 | 1 days supply | Qty: 4000 | Fill #0

## 2019-08-10 ENCOUNTER — Other Ambulatory Visit: Payer: Self-pay

## 2019-08-10 ENCOUNTER — Encounter: Payer: Self-pay | Admitting: Podiatry

## 2019-08-10 ENCOUNTER — Ambulatory Visit (INDEPENDENT_AMBULATORY_CARE_PROVIDER_SITE_OTHER): Payer: 59 | Admitting: Podiatry

## 2019-08-10 ENCOUNTER — Ambulatory Visit (INDEPENDENT_AMBULATORY_CARE_PROVIDER_SITE_OTHER): Payer: 59

## 2019-08-10 DIAGNOSIS — M7752 Other enthesopathy of left foot: Secondary | ICD-10-CM

## 2019-08-10 DIAGNOSIS — M779 Enthesopathy, unspecified: Secondary | ICD-10-CM | POA: Diagnosis not present

## 2019-08-10 DIAGNOSIS — M76822 Posterior tibial tendinitis, left leg: Secondary | ICD-10-CM

## 2019-09-05 NOTE — Progress Notes (Signed)
Subjective:   Patient ID: Gregory Holt, male   DOB: 64 y.o.   MRN: [id number]   HPI Patient states both my feet hurt but the left foot has really become bothersome recently as I have tried to be more active   ROS      Objective:  Physical Exam  Neurovascular status intact with discomfort on the medial side of the left arch with inflammation at posterior tibial insertion navicular with no indications of muscle strength loss currently     Assessment:  Appears to be acute posterior tibial tendinitis left     Plan:  Sterile prep went ahead and injected the tendon 3 mg Kenalog 5 mg Xylocaine after education rendered discussed supportive shoe gear ice therapy and anti-inflammatories  X-rays indicate that there is no indications of fracture or bone pathology

## 2019-09-14 ENCOUNTER — Ambulatory Visit: Payer: 59 | Attending: Internal Medicine

## 2019-09-14 DIAGNOSIS — Z23 Encounter for immunization: Secondary | ICD-10-CM

## 2019-09-14 NOTE — Progress Notes (Signed)
   Covid-19 Vaccination Clinic  Name:  Gregory Holt    MRN: [id number] DOB: March 25, 1955  09/14/2019  Gregory Holt was observed post Covid-19 immunization for 15 minutes without incident. He was provided with Vaccine Information Sheet and instruction to access the V-Safe system.   Gregory Holt was instructed to call 911 with any severe reactions post vaccine: Marland Kitchen Difficulty breathing  . Swelling of face and throat  . A fast heartbeat  . A bad rash all over body  . Dizziness and weakness   Immunizations Administered    Name Date Dose VIS Date Route   Pfizer COVID-19 Vaccine 09/14/2019  1:39 PM 0.3 mL 03/07/2018 Intramuscular   Manufacturer: [employer], [employer]   Lot: [id number]   [place]: [id number]

## 2019-09-19 MED FILL — PFIZER-BIONTECH COVID-19 VA: 30 | 1 days supply | Qty: 0 | Fill #0

## 2019-09-28 ENCOUNTER — Other Ambulatory Visit: Payer: Self-pay | Admitting: Oncology

## 2019-09-28 ENCOUNTER — Encounter: Payer: Self-pay | Admitting: Oncology

## 2019-09-28 DIAGNOSIS — U071 COVID-19: Secondary | ICD-10-CM

## 2019-09-28 NOTE — Progress Notes (Signed)
I connected by phone with  Gregory Holt  to discuss the potential use of an new treatment for mild to moderate COVID-19 viral infection in non-hospitalized patients.   This patient is a age/sex that meets the FDA criteria for Emergency Use Authorization of casirivimab\imdevimab.  Has a (+) direct SARS-CoV-2 viral test result 1. Has mild or moderate COVID-19  2. Is ? 62 years of age and weighs ? 40 kg 3. Is NOT hospitalized due to COVID-19 4. Is NOT requiring oxygen therapy or requiring an increase in baseline oxygen flow rate due to COVID-19 5. Is within 10 days of symptom onset 6. Has at least one of the high risk factor(s) for progression to severe COVID-19 and/or hospitalization as defined in EUA. Specific high risk criteria : Past Medical History:  Diagnosis Date  . Arthritis   . Cancer ([place])    basal cell skin cancer   . Hyperlipidemia   . Hypertension   ?  ?    Symptom onset  09/21/19   I have spoken and communicated the following to the patient or parent/caregiver:   1. FDA has authorized the emergency use of casirivimab\imdevimab for the treatment of mild to moderate COVID-19 in adults and pediatric patients with positive results of direct SARS-CoV-2 viral testing who are 66 years of age and older weighing at least 40 kg, and who are at high risk for progressing to severe COVID-19 and/or hospitalization.   2. The significant known and potential risks and benefits of casirivimab\imdevimab, and the extent to which such potential risks and benefits are unknown.   3. Information on available alternative treatments and the risks and benefits of those alternatives, including clinical trials.   4. Patients treated with casirivimab\imdevimab should continue to self-isolate and use infection control measures (e.g., wear mask, isolate, social distance, avoid sharing personal items, clean and disinfect "high touch" surfaces, and frequent handwashing) according to CDC guidelines.     5. The patient or parent/caregiver has the option to accept or refuse casirivimab\imdevimab .   After reviewing this information with the patient, The patient agreed to proceed with receiving casirivimab\imdevimab infusion and will be provided a copy of the Fact sheet prior to receiving the infusion.Rulon Abide, AGNP-C [phone] ([place])

## 2019-09-29 ENCOUNTER — Ambulatory Visit (HOSPITAL_COMMUNITY)
Admission: RE | Admit: 2019-09-29 | Discharge: 2019-09-29 | Disposition: A | Discharge status: Home / Self Care | Payer: 59 | Source: Ambulatory Visit | Attending: Pulmonary Disease | Admitting: Pulmonary Disease

## 2019-09-29 ENCOUNTER — Other Ambulatory Visit (HOSPITAL_COMMUNITY): Payer: Self-pay

## 2019-09-29 DIAGNOSIS — U071 COVID-19: Secondary | ICD-10-CM | POA: Insufficient documentation

## 2019-09-29 MED ORDER — FAMOTIDINE IN NACL 20-0.9 MG/50ML-% IV SOLN: 20.0000 mg | Freq: Once | INTRAVENOUS | Status: DC | PRN

## 2019-09-29 MED ORDER — DIPHENHYDRAMINE HCL 50 MG/ML IJ SOLN: 50.0000 mg | Freq: Once | INTRAMUSCULAR | Status: DC | PRN

## 2019-09-29 MED ORDER — METHYLPREDNISOLONE SODIUM SUCC 125 MG IJ SOLR: 125.0000 mg | Freq: Once | INTRAMUSCULAR | Status: DC | PRN

## 2019-09-29 MED ORDER — SODIUM CHLORIDE 0.9 % IV SOLN: INTRAVENOUS | Status: DC | PRN

## 2019-09-29 MED ORDER — ALBUTEROL SULFATE HFA 108 (90 BASE) MCG/ACT IN AERS: 2.0000 | INHALATION_SPRAY | Freq: Once | RESPIRATORY_TRACT | Status: DC | PRN

## 2019-09-29 MED ORDER — EPINEPHRINE 0.3 MG/0.3ML IJ SOAJ: 0.3000 mg | Freq: Once | INTRAMUSCULAR | Status: DC | PRN

## 2019-09-29 MED ORDER — SODIUM CHLORIDE 0.9 % IV SOLN
1200.0000 mg | Freq: Once | INTRAVENOUS | Status: AC
  Administered 2019-09-29: 1200 mg via INTRAVENOUS

## 2019-09-29 NOTE — Discharge Instructions (Signed)

## 2019-11-23 ENCOUNTER — Other Ambulatory Visit (HOSPITAL_BASED_OUTPATIENT_CLINIC_OR_DEPARTMENT_OTHER): Payer: Self-pay | Admitting: Dermatology

## 2019-11-27 MED FILL — HYDROCORTISONE 2.5% CREAM: 2.5 | 14 days supply | Qty: 30 | Fill #0

## 2020-01-08 ENCOUNTER — Other Ambulatory Visit (HOSPITAL_BASED_OUTPATIENT_CLINIC_OR_DEPARTMENT_OTHER): Payer: Self-pay | Admitting: Internal Medicine

## 2020-01-08 ENCOUNTER — Ambulatory Visit: Payer: 59 | Attending: Internal Medicine

## 2020-01-08 DIAGNOSIS — Z23 Encounter for immunization: Secondary | ICD-10-CM

## 2020-01-08 MED FILL — PFIZER-BIONTECH COVID-19 VA: 30 | 21 days supply | Qty: 0 | Fill #0

## 2020-01-08 NOTE — Progress Notes (Signed)
   Covid-19 Vaccination Clinic  Name:  Gregory Holt    MRN: [id number] DOB: 1955-10-05  01/08/2020  [name] was observed post Covid-19 immunization for 15 minutes without incident. He was provided with Vaccine Information Sheet and instruction to access the V-Safe system.  Vaccinated by Fredirick Maudlin  [name] was instructed to call 911 with any severe reactions post vaccine: Marland Kitchen Difficulty breathing  . Swelling of face and throat  . A fast heartbeat  . A bad rash all over body  . Dizziness and weakness   Immunizations Administered    Name Date Dose VIS Date Route   Pfizer COVID-19 Vaccine 01/08/2020 12:36 PM 0.3 mL 10/31/2019 Intramuscular   Manufacturer: [employer], [employer]   Lot: [id number]   [place]: [address]-5

## 2021-01-15 ENCOUNTER — Other Ambulatory Visit (HOSPITAL_BASED_OUTPATIENT_CLINIC_OR_DEPARTMENT_OTHER): Payer: Self-pay

## 2021-01-19 ENCOUNTER — Other Ambulatory Visit (HOSPITAL_BASED_OUTPATIENT_CLINIC_OR_DEPARTMENT_OTHER): Payer: Self-pay

## 2021-04-03 DIAGNOSIS — D0472 Carcinoma in situ of skin of left lower limb, including hip: Secondary | ICD-10-CM | POA: Diagnosis not present

## 2021-04-03 DIAGNOSIS — L814 Other melanin hyperpigmentation: Secondary | ICD-10-CM | POA: Diagnosis not present

## 2021-04-03 DIAGNOSIS — L821 Other seborrheic keratosis: Secondary | ICD-10-CM | POA: Diagnosis not present

## 2021-04-03 DIAGNOSIS — D225 Melanocytic nevi of trunk: Secondary | ICD-10-CM | POA: Diagnosis not present

## 2021-04-03 DIAGNOSIS — L570 Actinic keratosis: Secondary | ICD-10-CM | POA: Diagnosis not present

## 2021-04-03 DIAGNOSIS — C4441 Basal cell carcinoma of skin of scalp and neck: Secondary | ICD-10-CM | POA: Diagnosis not present

## 2021-04-28 DIAGNOSIS — E7800 Pure hypercholesterolemia, unspecified: Secondary | ICD-10-CM | POA: Diagnosis not present

## 2021-04-28 DIAGNOSIS — I10 Essential (primary) hypertension: Secondary | ICD-10-CM | POA: Diagnosis not present

## 2021-04-28 DIAGNOSIS — R7303 Prediabetes: Secondary | ICD-10-CM | POA: Diagnosis not present

## 2021-04-28 DIAGNOSIS — R748 Abnormal levels of other serum enzymes: Secondary | ICD-10-CM | POA: Diagnosis not present

## 2021-08-13 DIAGNOSIS — D485 Neoplasm of uncertain behavior of skin: Secondary | ICD-10-CM | POA: Diagnosis not present

## 2021-08-13 DIAGNOSIS — L988 Other specified disorders of the skin and subcutaneous tissue: Secondary | ICD-10-CM | POA: Diagnosis not present

## 2021-09-28 DIAGNOSIS — H43812 Vitreous degeneration, left eye: Secondary | ICD-10-CM | POA: Diagnosis not present

## 2021-10-21 DIAGNOSIS — H43812 Vitreous degeneration, left eye: Secondary | ICD-10-CM | POA: Diagnosis not present

## 2021-10-21 DIAGNOSIS — H2513 Age-related nuclear cataract, bilateral: Secondary | ICD-10-CM | POA: Diagnosis not present

## 2021-12-07 DIAGNOSIS — N529 Male erectile dysfunction, unspecified: Secondary | ICD-10-CM | POA: Diagnosis not present

## 2021-12-07 DIAGNOSIS — Z0000 Encounter for general adult medical examination without abnormal findings: Secondary | ICD-10-CM | POA: Diagnosis not present

## 2021-12-07 DIAGNOSIS — Z23 Encounter for immunization: Secondary | ICD-10-CM | POA: Diagnosis not present

## 2021-12-07 DIAGNOSIS — I10 Essential (primary) hypertension: Secondary | ICD-10-CM | POA: Diagnosis not present

## 2021-12-07 DIAGNOSIS — Z125 Encounter for screening for malignant neoplasm of prostate: Secondary | ICD-10-CM | POA: Diagnosis not present

## 2021-12-07 DIAGNOSIS — Z6838 Body mass index (BMI) 38.0-38.9, adult: Secondary | ICD-10-CM | POA: Diagnosis not present

## 2021-12-07 DIAGNOSIS — E7800 Pure hypercholesterolemia, unspecified: Secondary | ICD-10-CM | POA: Diagnosis not present

## 2021-12-07 DIAGNOSIS — R7303 Prediabetes: Secondary | ICD-10-CM | POA: Diagnosis not present

## 2022-03-10 DIAGNOSIS — H524 Presbyopia: Secondary | ICD-10-CM | POA: Diagnosis not present

## 2022-03-10 DIAGNOSIS — H43812 Vitreous degeneration, left eye: Secondary | ICD-10-CM | POA: Diagnosis not present

## 2022-03-10 DIAGNOSIS — H2513 Age-related nuclear cataract, bilateral: Secondary | ICD-10-CM | POA: Diagnosis not present

## 2022-03-10 DIAGNOSIS — H35371 Puckering of macula, right eye: Secondary | ICD-10-CM | POA: Diagnosis not present

## 2022-03-10 DIAGNOSIS — H52223 Regular astigmatism, bilateral: Secondary | ICD-10-CM | POA: Diagnosis not present

## 2022-03-10 DIAGNOSIS — H5213 Myopia, bilateral: Secondary | ICD-10-CM | POA: Diagnosis not present

## 2022-04-14 DIAGNOSIS — B078 Other viral warts: Secondary | ICD-10-CM | POA: Diagnosis not present

## 2022-04-14 DIAGNOSIS — L570 Actinic keratosis: Secondary | ICD-10-CM | POA: Diagnosis not present

## 2022-04-14 DIAGNOSIS — D485 Neoplasm of uncertain behavior of skin: Secondary | ICD-10-CM | POA: Diagnosis not present

## 2022-04-14 DIAGNOSIS — Z85828 Personal history of other malignant neoplasm of skin: Secondary | ICD-10-CM | POA: Diagnosis not present

## 2022-04-14 DIAGNOSIS — L814 Other melanin hyperpigmentation: Secondary | ICD-10-CM | POA: Diagnosis not present

## 2022-04-14 DIAGNOSIS — L820 Inflamed seborrheic keratosis: Secondary | ICD-10-CM | POA: Diagnosis not present

## 2022-04-14 DIAGNOSIS — D225 Melanocytic nevi of trunk: Secondary | ICD-10-CM | POA: Diagnosis not present

## 2022-04-14 DIAGNOSIS — L821 Other seborrheic keratosis: Secondary | ICD-10-CM | POA: Diagnosis not present

## 2022-07-12 DIAGNOSIS — R7303 Prediabetes: Secondary | ICD-10-CM | POA: Diagnosis not present

## 2022-07-12 DIAGNOSIS — E7800 Pure hypercholesterolemia, unspecified: Secondary | ICD-10-CM | POA: Diagnosis not present

## 2022-07-12 DIAGNOSIS — I10 Essential (primary) hypertension: Secondary | ICD-10-CM | POA: Diagnosis not present

## 2022-07-12 DIAGNOSIS — R600 Localized edema: Secondary | ICD-10-CM | POA: Diagnosis not present

## 2022-09-17 DIAGNOSIS — R079 Chest pain, unspecified: Secondary | ICD-10-CM | POA: Diagnosis not present

## 2022-12-29 DIAGNOSIS — R072 Precordial pain: Secondary | ICD-10-CM | POA: Diagnosis not present

## 2022-12-29 DIAGNOSIS — E782 Mixed hyperlipidemia: Secondary | ICD-10-CM | POA: Diagnosis not present

## 2022-12-29 DIAGNOSIS — Z125 Encounter for screening for malignant neoplasm of prostate: Secondary | ICD-10-CM | POA: Diagnosis not present

## 2022-12-29 DIAGNOSIS — Z0000 Encounter for general adult medical examination without abnormal findings: Secondary | ICD-10-CM | POA: Diagnosis not present

## 2022-12-29 DIAGNOSIS — Z23 Encounter for immunization: Secondary | ICD-10-CM | POA: Diagnosis not present

## 2022-12-29 DIAGNOSIS — Z1159 Encounter for screening for other viral diseases: Secondary | ICD-10-CM | POA: Diagnosis not present

## 2022-12-29 DIAGNOSIS — R600 Localized edema: Secondary | ICD-10-CM | POA: Diagnosis not present

## 2022-12-29 DIAGNOSIS — I10 Essential (primary) hypertension: Secondary | ICD-10-CM | POA: Diagnosis not present

## 2023-01-14 DIAGNOSIS — E785 Hyperlipidemia, unspecified: Secondary | ICD-10-CM | POA: Diagnosis not present

## 2023-01-14 DIAGNOSIS — R079 Chest pain, unspecified: Secondary | ICD-10-CM | POA: Diagnosis not present

## 2023-01-14 DIAGNOSIS — R0602 Shortness of breath: Secondary | ICD-10-CM | POA: Diagnosis not present

## 2023-01-14 DIAGNOSIS — I10 Essential (primary) hypertension: Secondary | ICD-10-CM | POA: Diagnosis not present

## 2023-01-14 DIAGNOSIS — R0789 Other chest pain: Secondary | ICD-10-CM | POA: Diagnosis not present

## 2023-01-14 DIAGNOSIS — I2089 Other forms of angina pectoris: Secondary | ICD-10-CM | POA: Diagnosis not present

## 2023-02-01 DIAGNOSIS — R069 Unspecified abnormalities of breathing: Secondary | ICD-10-CM | POA: Diagnosis not present

## 2023-02-01 DIAGNOSIS — I2089 Other forms of angina pectoris: Secondary | ICD-10-CM | POA: Diagnosis not present

## 2023-02-04 DIAGNOSIS — R079 Chest pain, unspecified: Secondary | ICD-10-CM | POA: Diagnosis not present

## 2023-02-17 DIAGNOSIS — Z79899 Other long term (current) drug therapy: Secondary | ICD-10-CM | POA: Diagnosis not present

## 2023-02-25 DIAGNOSIS — I10 Essential (primary) hypertension: Secondary | ICD-10-CM | POA: Diagnosis not present

## 2023-02-25 DIAGNOSIS — R9439 Abnormal result of other cardiovascular function study: Secondary | ICD-10-CM | POA: Diagnosis not present

## 2023-02-25 DIAGNOSIS — I25118 Atherosclerotic heart disease of native coronary artery with other forms of angina pectoris: Secondary | ICD-10-CM | POA: Diagnosis not present

## 2023-02-25 DIAGNOSIS — E785 Hyperlipidemia, unspecified: Secondary | ICD-10-CM | POA: Diagnosis not present
